# Patient Record
Sex: Female | Born: 1945 | Race: Black or African American | Hispanic: No | Marital: Single | Smoking: Never smoker
Health system: Southern US, Community
[De-identification: ages and names within clinical notes are randomized; demographics above are authoritative.]

## PROBLEM LIST (undated history)

## (undated) DIAGNOSIS — E7800 Pure hypercholesterolemia, unspecified: Secondary | ICD-10-CM

---

## 2000-06-20 ENCOUNTER — Encounter: Admission: RE | Admit: 2000-06-20 | Discharge: 2000-06-20 | Payer: Self-pay | Admitting: Internal Medicine

## 2000-06-20 ENCOUNTER — Encounter: Payer: Self-pay | Admitting: Internal Medicine

## 2001-01-13 ENCOUNTER — Emergency Department (HOSPITAL_COMMUNITY): Admission: EM | Admit: 2001-01-13 | Discharge: 2001-01-13 | Payer: Self-pay | Admitting: Emergency Medicine

## 2001-06-21 ENCOUNTER — Encounter: Payer: Self-pay | Admitting: Internal Medicine

## 2001-06-21 ENCOUNTER — Encounter: Admission: RE | Admit: 2001-06-21 | Discharge: 2001-06-21 | Payer: Self-pay | Admitting: Internal Medicine

## 2003-05-08 ENCOUNTER — Encounter: Admission: RE | Admit: 2003-05-08 | Discharge: 2003-05-08 | Payer: Self-pay | Admitting: Internal Medicine

## 2003-05-08 ENCOUNTER — Encounter: Payer: Self-pay | Admitting: Internal Medicine

## 2004-05-15 ENCOUNTER — Encounter: Admission: RE | Admit: 2004-05-15 | Discharge: 2004-05-15 | Payer: Self-pay | Admitting: Internal Medicine

## 2004-05-21 ENCOUNTER — Ambulatory Visit (HOSPITAL_COMMUNITY): Admission: RE | Admit: 2004-05-21 | Discharge: 2004-05-21 | Payer: Self-pay | Admitting: Gastroenterology

## 2006-10-05 ENCOUNTER — Encounter: Admission: RE | Admit: 2006-10-05 | Discharge: 2006-10-05 | Payer: Self-pay | Admitting: Internal Medicine

## 2007-06-05 ENCOUNTER — Encounter: Admission: RE | Admit: 2007-06-05 | Discharge: 2007-06-05 | Payer: Self-pay | Admitting: Internal Medicine

## 2007-10-25 ENCOUNTER — Encounter: Admission: RE | Admit: 2007-10-25 | Discharge: 2007-10-25 | Payer: Self-pay | Admitting: Internal Medicine

## 2009-01-23 ENCOUNTER — Encounter: Admission: RE | Admit: 2009-01-23 | Discharge: 2009-01-23 | Payer: Self-pay | Admitting: Internal Medicine

## 2009-05-14 ENCOUNTER — Encounter: Admission: RE | Admit: 2009-05-14 | Discharge: 2009-05-14 | Payer: Self-pay | Admitting: Internal Medicine

## 2010-05-28 IMAGING — US US TRANSVAGINAL NON-OB
1 series · 14 of 25 positions shown · non-contrast
Comparison: None

CLINICAL DATA: Pelvic pain

TRANSABDOMINAL AND TRANSVAGINAL ULTRASOUND OF PELVIS
TECHNIQUE: Both transabdominal and transvaginal ultrasound
examinations of the pelvis were performed including evaluation of
the uterus, ovaries, adnexal regions, and pelvic cul-de-sac.

[Series 1: us transvaginal non-ob · 0.27mm/px · 14 of 48 slices shown]
[im 1/48]
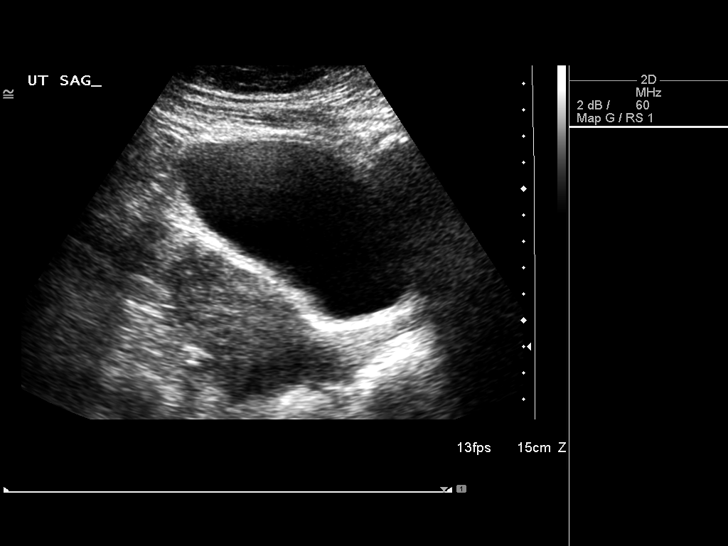
[im 4/48]
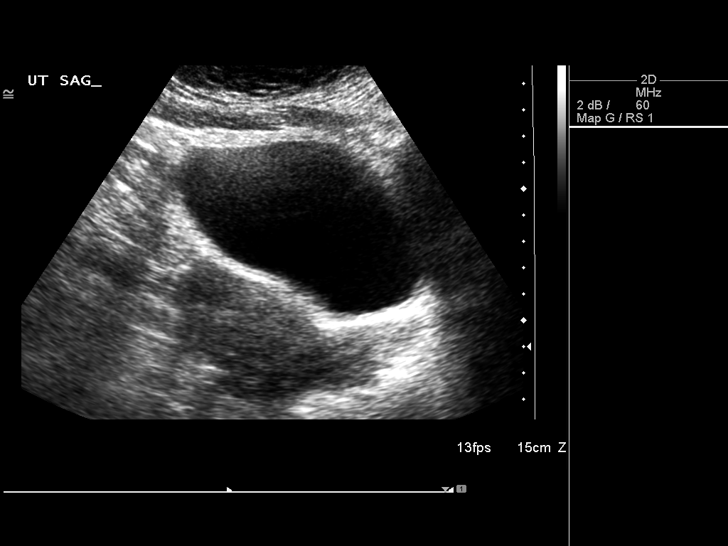
[im 8/48]
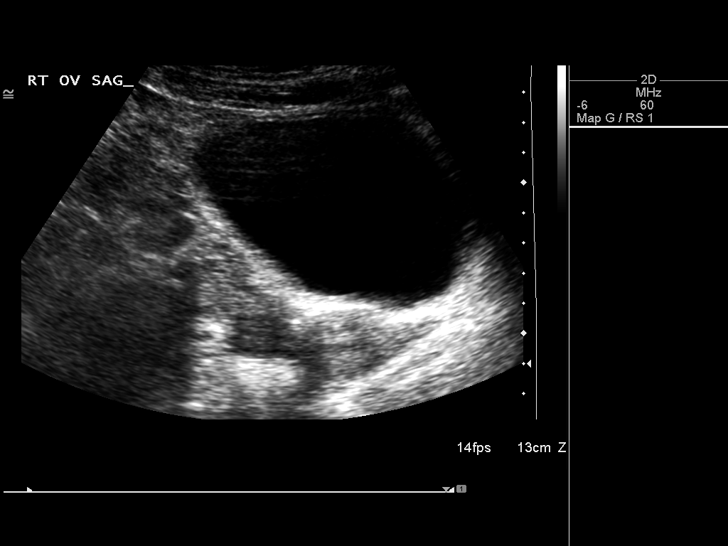
[im 12/48]
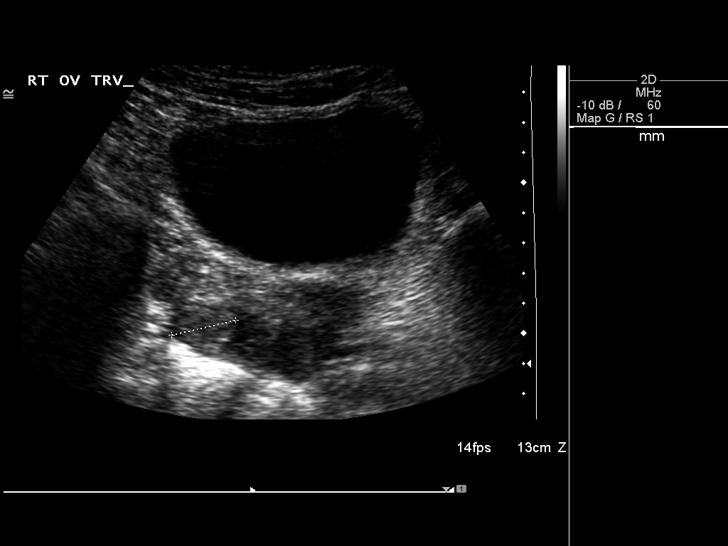
[im 16/48]
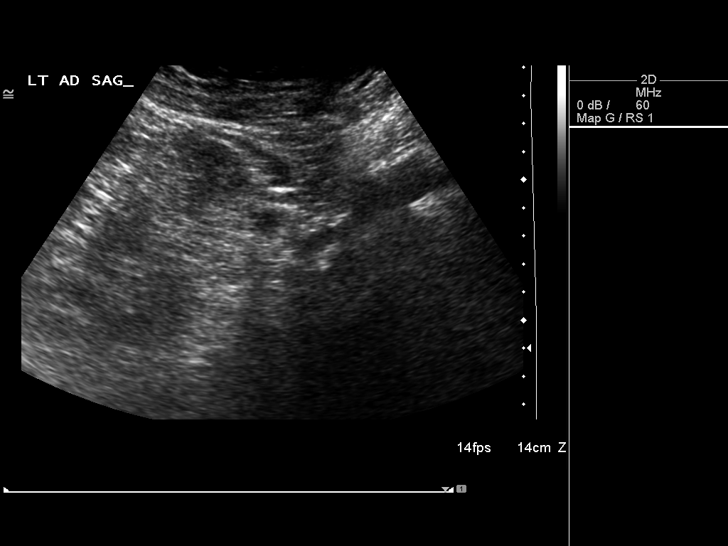
[im 18/48]
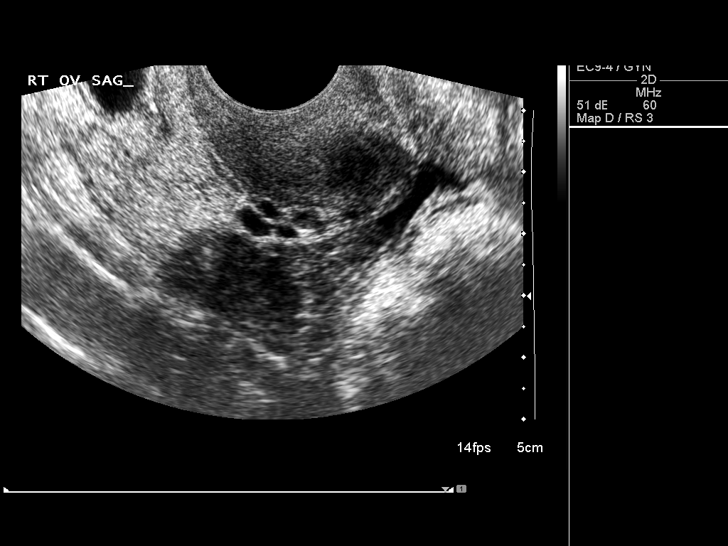
[im 22/48]
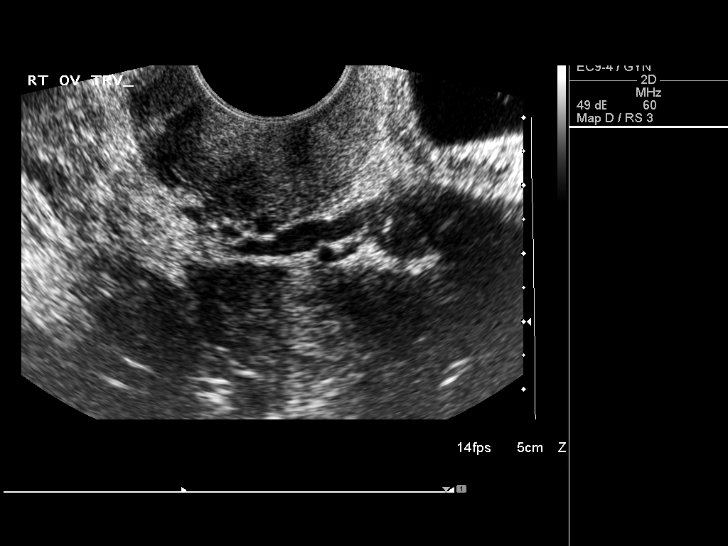
[im 26/48]
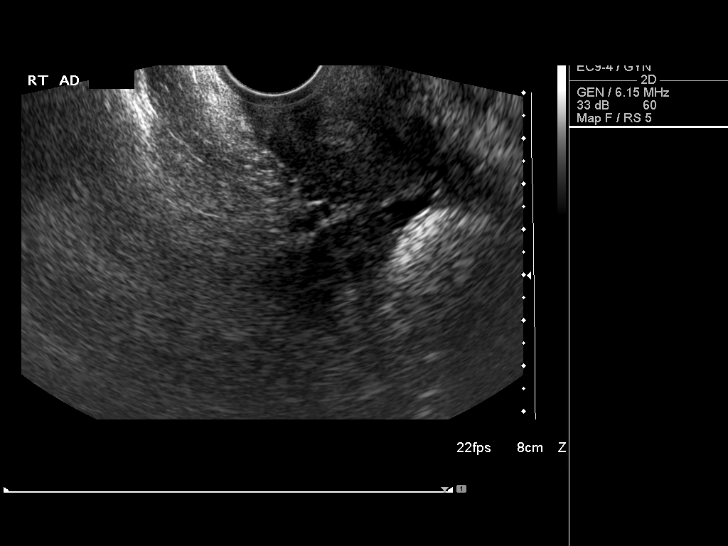
[im 30/48]
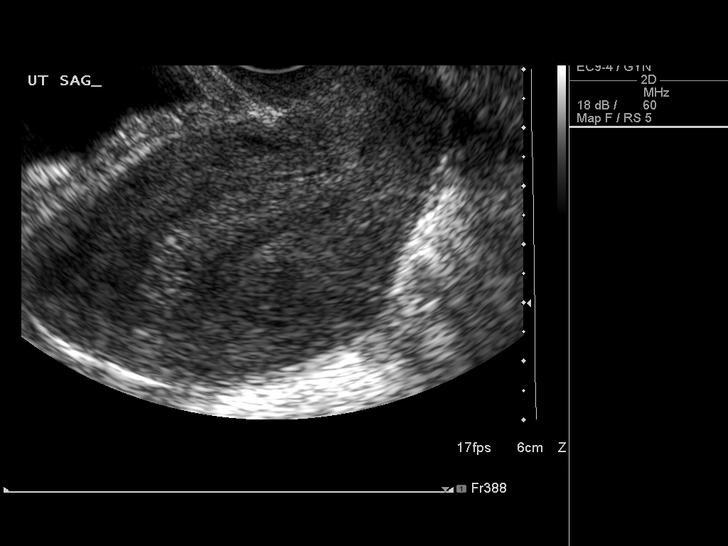
[im 32/48]
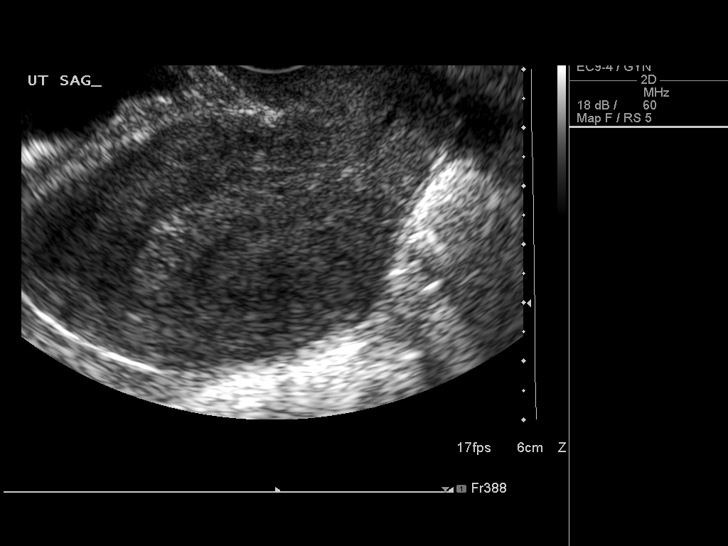
[im 36/48]
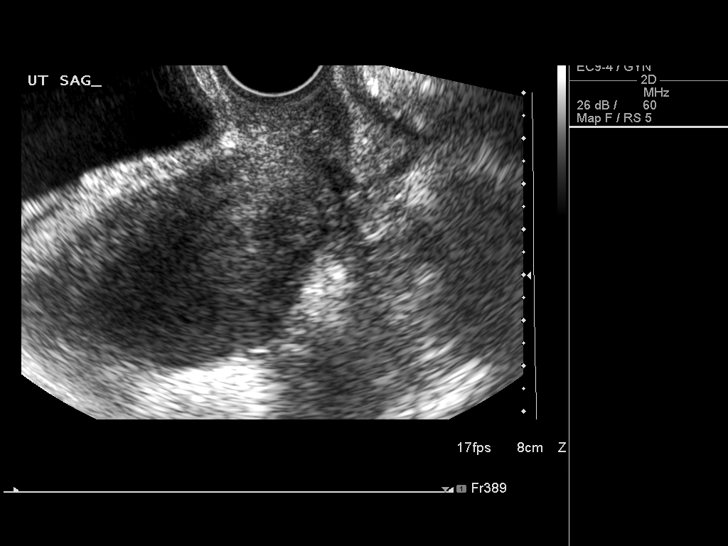
[im 40/48]
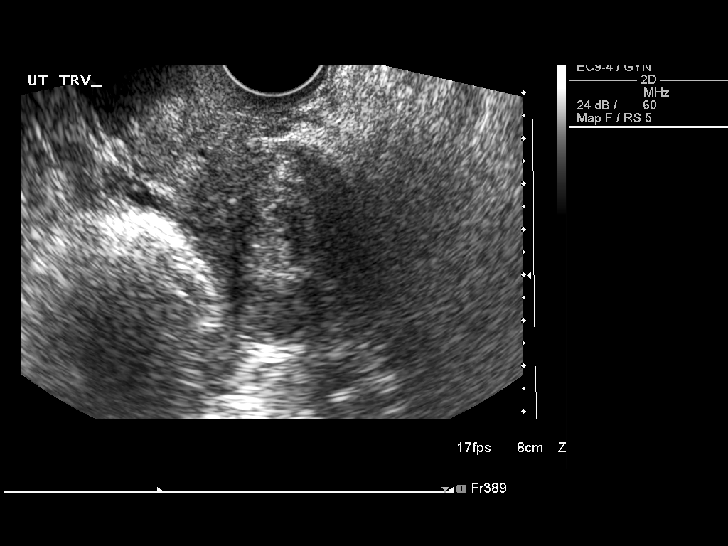
[im 44/48]
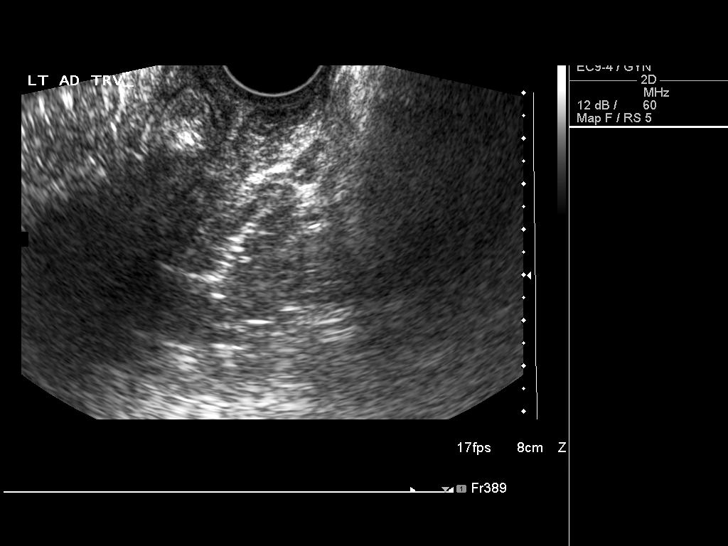
[im 48/48]
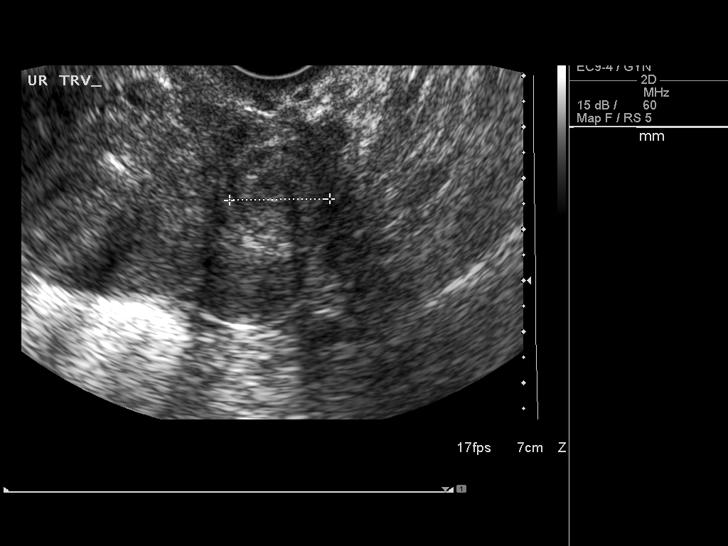

[14 of 25 positions shown; findings below may reference images not displayed]

FINDINGS: Uterus:  The uterus measures 6.6 cm sagittally, with a depth of
cm and width of 6.5 cm.  There do appear to be two uterine fibroids
present.  The larger is posterior in the lower uterine segment
measuring 2.4 x 2.2 x 2.0 cm.  A second fibroid is anteriorly
located within the upper body near the fundus measuring 1.5 x 1.2 x
1.3 cm.

Endometrium:  The endometrium is within the upper limits of normal
for a post menopausal patient not on hormone replacement therapy,
measuring 7.9 mm in thickness.  No focal abnormality is seen.

Right Ovary:  The right ovary is normal in size.

Left Ovary:  The left ovary is not visualized

Other Findings:  No free fluid is seen.
IMPRESSION: 1.  Two uterine fibroids as noted above.
2.  The endometrium is within upper limits of normal for a post
menopausal patient not on hormone replacement therapy.

## 2010-06-04 ENCOUNTER — Encounter: Admission: RE | Admit: 2010-06-04 | Discharge: 2010-06-04 | Payer: Self-pay | Admitting: Internal Medicine

## 2010-12-06 ENCOUNTER — Encounter: Payer: Self-pay | Admitting: Internal Medicine

## 2011-04-02 NOTE — Op Note (Signed)
[name], Andrea Doyle                      ACCOUNT NO.:  [id number]   MEDICAL RECORD NO.:  [id number]                   PATIENT TYPE:  AMB   LOCATION:  ENDO                                 FACILITY:  [facility]   PHYSICIAN:  Petra Kuba, M.D.                 DATE OF BIRTH:  01/20/1946   DATE OF PROCEDURE:  05/21/2004  DATE OF DISCHARGE:                                 OPERATIVE REPORT   PROCEDURE:  Colonoscopy.   INDICATION:  Screening.   Consent was signed after risks, benefits, methods, options thoroughly  discussed in the office.   MEDICINES USED:  1. Demerol 80.  2. Versed 7.5.   PROCEDURE:  Rectal inspection is pertinent for external hemorrhoids.  Small  digital exam was negative.  Video pediatric adjustable colonoscope was  inserted, easily advanced to the level of the ileocecal valve.  This did  require abdominal pressure but no position changes. To get into the cecal  pole, required rolling her on her back and then on her right side.  No  abnormality was seen on insertion.  The cecum was identified by the  appendiceal orifice and the ileocecal valve.  The scope was slowly  withdrawn, the prep was adequate.  There was some liquid stool that required  washing and suctioning, but on slow withdrawal through the colon no polyps,  tumors, masses, diverticula or other abnormalities were seen as we slowly  withdrew back to the rectum.  Anorectal pull through on retroflexion  confirmed some small hemorrhoids.  The scope was reinserted a short ways up  the left side of the colon, air was suctioned, the scope removed.  The  patient tolerated the procedure well.  There was no obvious immediate  complications.   ENDOSCOPIC DIAGNOSES:  1. Internal, external small hemorrhoids.  2. Otherwise within normal limits to the cecum.   PLAN:  Yearly rectals and guaiacs per primary care Dr. Su Hilt.  Happy to  see back p.r.n.  Otherwise, repeat screening in 5 years unless protocols  change.                                               Petra Kuba, M.D.    MEM/MEDQ  D:  05/21/2004  T:  05/21/2004  Job:  [id number]   cc:   Antony Madura, M.D.  [address]  [place]  [place] [address]  Fax: [phone]

## 2012-04-08 ENCOUNTER — Emergency Department (HOSPITAL_COMMUNITY)
Admission: EM | Admit: 2012-04-08 | Discharge: 2012-04-08 | Disposition: A | Discharge status: Home / Self Care | Payer: No Typology Code available for payment source | Attending: Emergency Medicine | Admitting: Emergency Medicine

## 2012-04-08 ENCOUNTER — Emergency Department (HOSPITAL_COMMUNITY): Payer: No Typology Code available for payment source

## 2012-04-08 ENCOUNTER — Encounter (HOSPITAL_COMMUNITY): Payer: Self-pay | Admitting: *Deleted

## 2012-04-08 DIAGNOSIS — E7800 Pure hypercholesterolemia, unspecified: Secondary | ICD-10-CM | POA: Insufficient documentation

## 2012-04-08 DIAGNOSIS — Z043 Encounter for examination and observation following other accident: Secondary | ICD-10-CM | POA: Insufficient documentation

## 2012-04-08 DIAGNOSIS — E119 Type 2 diabetes mellitus without complications: Secondary | ICD-10-CM | POA: Insufficient documentation

## 2012-04-08 HISTORY — DX: Pure hypercholesterolemia, unspecified: E78.00

## 2012-04-08 MED ORDER — IBUPROFEN 600 MG PO TABS: 600.0000 mg | ORAL_TABLET | Freq: Four times a day (QID) | ORAL | Status: AC | PRN

## 2012-04-08 MED ORDER — CYCLOBENZAPRINE HCL 10 MG PO TABS: 10.0000 mg | ORAL_TABLET | Freq: Two times a day (BID) | ORAL | Status: AC | PRN
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
## 2012-04-08 NOTE — ED Provider Notes (Signed)
Medical screening examination/treatment/procedure(s) were performed by non-physician practitioner and as supervising physician I was immediately available for consultation/collaboration.   Kristie Bracewell, MD 04/08/12 2135 
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
## 2012-11-21 ENCOUNTER — Other Ambulatory Visit: Payer: Self-pay | Admitting: Internal Medicine

## 2012-11-21 DIAGNOSIS — K30 Functional dyspepsia: Secondary | ICD-10-CM

## 2012-11-27 ENCOUNTER — Ambulatory Visit
Admission: RE | Admit: 2012-11-27 | Discharge: 2012-11-27 | Disposition: A | Discharge status: Home / Self Care | Payer: BC Managed Care – PPO | Source: Ambulatory Visit | Attending: Internal Medicine | Admitting: Internal Medicine

## 2012-11-27 DIAGNOSIS — K30 Functional dyspepsia: Secondary | ICD-10-CM

## 2013-02-08 ENCOUNTER — Ambulatory Visit
Admission: RE | Admit: 2013-02-08 | Discharge: 2013-02-08 | Disposition: A | Discharge status: Home / Self Care | Payer: Self-pay | Source: Ambulatory Visit

## 2013-02-08 ENCOUNTER — Other Ambulatory Visit: Payer: Self-pay

## 2013-02-08 DIAGNOSIS — Z1231 Encounter for screening mammogram for malignant neoplasm of breast: Secondary | ICD-10-CM

## 2014-06-21 ENCOUNTER — Emergency Department (HOSPITAL_COMMUNITY)
Admission: EM | Admit: 2014-06-21 | Discharge: 2014-06-21 | Disposition: A | Discharge status: Home / Self Care | Payer: Medicare HMO | Attending: Emergency Medicine | Admitting: Emergency Medicine

## 2014-06-21 ENCOUNTER — Encounter (HOSPITAL_COMMUNITY): Payer: Self-pay | Admitting: Emergency Medicine

## 2014-06-21 DIAGNOSIS — R42 Dizziness and giddiness: Secondary | ICD-10-CM | POA: Diagnosis not present

## 2014-06-21 DIAGNOSIS — E7800 Pure hypercholesterolemia, unspecified: Secondary | ICD-10-CM | POA: Diagnosis not present

## 2014-06-21 DIAGNOSIS — Z79899 Other long term (current) drug therapy: Secondary | ICD-10-CM | POA: Insufficient documentation

## 2014-06-21 DIAGNOSIS — R739 Hyperglycemia, unspecified: Secondary | ICD-10-CM

## 2014-06-21 DIAGNOSIS — E119 Type 2 diabetes mellitus without complications: Secondary | ICD-10-CM | POA: Diagnosis present

## 2014-06-21 LAB — BASIC METABOLIC PANEL
Anion gap: 17 — ABNORMAL HIGH (ref 5–15)
BUN: 20 mg/dL (ref 6–23)
CALCIUM: 9.6 mg/dL (ref 8.4–10.5)
CHLORIDE: 96 meq/L (ref 96–112)
CO2: 25 mEq/L (ref 19–32)
CREATININE: 0.86 mg/dL (ref 0.50–1.10)
GFR, EST AFRICAN AMERICAN: 80 mL/min — AB (ref >90)
GFR, EST NON AFRICAN AMERICAN: 69 mL/min — AB (ref >90)
Glucose, Bld: 243 mg/dL — ABNORMAL HIGH (ref 70–99)
Potassium: 3.2 mEq/L — ABNORMAL LOW (ref 3.7–5.3)
SODIUM: 138 meq/L (ref 137–147)

## 2014-06-21 LAB — CBC WITH DIFFERENTIAL/PLATELET
BASOS PCT: 0 % (ref 0–1)
Basophils Absolute: 0 10*3/uL (ref 0.0–0.1)
EOS ABS: 0.2 10*3/uL (ref 0.0–0.7)
EOS PCT: 2 % (ref 0–5)
HCT: 40.6 % (ref 36.0–46.0)
HEMOGLOBIN: 14.5 g/dL (ref 12.0–15.0)
LYMPHS PCT: 29 % (ref 12–46)
Lymphs Abs: 2.5 10*3/uL (ref 0.7–4.0)
MCH: 31.7 pg (ref 26.0–34.0)
MCHC: 35.7 g/dL (ref 30.0–36.0)
MCV: 88.8 fL (ref 78.0–100.0)
Monocytes Absolute: 0.5 10*3/uL (ref 0.1–1.0)
Monocytes Relative: 6 % (ref 3–12)
NEUTROS PCT: 63 % (ref 43–77)
Neutro Abs: 5.4 10*3/uL (ref 1.7–7.7)
PLATELETS: 317 10*3/uL (ref 150–400)
RBC: 4.57 MIL/uL (ref 3.87–5.11)
RDW: 12.7 % (ref 11.5–15.5)
WBC: 8.6 10*3/uL (ref 4.0–10.5)

## 2014-06-21 LAB — CBG MONITORING, ED
GLUCOSE-CAPILLARY: 215 mg/dL — AB (ref 70–99)
Glucose-Capillary: 154 mg/dL — ABNORMAL HIGH (ref 70–99)

## 2014-06-21 MED ORDER — SODIUM CHLORIDE 0.9 % IV BOLUS (SEPSIS)
1000.0000 mL | Freq: Once | INTRAVENOUS | Status: AC
  Administered 2014-06-21: 1000 mL via INTRAVENOUS

## 2014-06-21 MED ORDER — MECLIZINE HCL 50 MG PO TABS: 50.0000 mg | ORAL_TABLET | Freq: Three times a day (TID) | ORAL | Status: AC | PRN

## 2014-06-21 MED ORDER — MECLIZINE HCL 25 MG PO TABS
25.0000 mg | ORAL_TABLET | Freq: Once | ORAL | Status: DC
  Filled 2014-06-21: qty 1
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
## 2014-06-21 NOTE — Discharge Instructions (Signed)
Dizziness °Dizziness is a common problem. It is a feeling of unsteadiness or light-headedness. You may feel like you are about to faint. Dizziness can lead to injury if you stumble or fall. A person of any age group can suffer from dizziness, but dizziness is more common in older adults. °CAUSES  °Dizziness can be caused by many different things, including: °· Middle ear problems. °· Standing for too long. °· Infections. °· An allergic reaction. °· Aging. °· An emotional response to something, such as the sight of blood. °· Side effects of medicines. °· Tiredness. °· Problems with circulation or blood pressure. °· Excessive use of alcohol or medicines, or illegal drug use. °· Breathing too fast (hyperventilation). °· An irregular heart rhythm (arrhythmia). °· A low red blood cell count (anemia). °· Pregnancy. °· Vomiting, diarrhea, fever, or other illnesses that cause body fluid loss (dehydration). °· Diseases or conditions such as Parkinson's disease, high blood pressure (hypertension), diabetes, and thyroid problems. °· Exposure to extreme heat. °DIAGNOSIS  °Your health care provider will ask about your symptoms, perform a physical exam, and perform an electrocardiogram (ECG) to record the electrical activity of your heart. Your health care provider may also perform other heart or blood tests to determine the cause of your dizziness. These may include: °· Transthoracic echocardiogram (TTE). During echocardiography, sound waves are used to evaluate how blood flows through your heart. °· Transesophageal echocardiogram (TEE). °· Cardiac monitoring. This allows your health care provider to monitor your heart rate and rhythm in real time. °· Holter monitor. This is a portable device that records your heartbeat and can help diagnose heart arrhythmias. It allows your health care provider to track your heart activity for several days if needed. °· Stress tests by exercise or by giving medicine that makes the heart beat  faster. °TREATMENT  °Treatment of dizziness depends on the cause of your symptoms and can vary greatly. °HOME CARE INSTRUCTIONS  °· Drink enough fluids to keep your urine clear or pale yellow. This is especially important in very hot weather. In older adults, it is also important in cold weather. °· Take your medicine exactly as directed if your dizziness is caused by medicines. When taking blood pressure medicines, it is especially important to get up slowly. °¨ Rise slowly from chairs and steady yourself until you feel okay. °¨ In the morning, first sit up on the side of the bed. When you feel okay, stand slowly while holding onto something until you know your balance is fine. °· Move your legs often if you need to stand in one place for a long time. Tighten and relax your muscles in your legs while standing. °· Have someone stay with you for 1-2 days if dizziness continues to be a problem. Do this until you feel you are well enough to stay alone. Have the person call your health care provider if he or she notices changes in you that are concerning. °· Do not drive or use heavy machinery if you feel dizzy. °· Do not drink alcohol. °SEEK IMMEDIATE MEDICAL CARE IF:  °· Your dizziness or light-headedness gets worse. °· You feel nauseous or vomit. °· You have problems talking, walking, or using your arms, hands, or legs. °· You feel weak. °· You are not thinking clearly or you have trouble forming sentences. It may take a friend or family member to notice this. °· You have chest pain, abdominal pain, shortness of breath, or sweating. °· Your vision changes. °· You notice   any bleeding.  You have side effects from medicine that seems to be getting worse rather than better. MAKE SURE YOU:   Understand these instructions.  Will watch your condition.  Will get help right away if you are not doing well or get worse. Document Released: 04/27/2001 Document Revised: 11/06/2013 Document Reviewed: 05/21/2011 [facility]  Patient Information 2015 [place], [place]. This information is not intended to replace advice given to you by your health care provider. Make sure you discuss any questions you have with your health care provider.  Benign Positional Vertigo Vertigo means you feel like you or your surroundings are moving when they are not. Benign positional vertigo is the most common form of vertigo. Benign means that the cause of your condition is not serious. Benign positional vertigo is more common in older adults. CAUSES  Benign positional vertigo is the result of an upset in the labyrinth system. This is an area in the middle ear that helps control your balance. This may be caused by a viral infection, head injury, or repetitive motion. However, often no specific cause is found. SYMPTOMS  Symptoms of benign positional vertigo occur when you move your head or eyes in different directions. Some of the symptoms may include:  Loss of balance and falls.  Vomiting.  Blurred vision.  Dizziness.  Nausea.  Involuntary eye movements (nystagmus). DIAGNOSIS  Benign positional vertigo is usually diagnosed by physical exam. If the specific cause of your benign positional vertigo is unknown, your caregiver may perform imaging tests, such as magnetic resonance imaging (MRI) or computed tomography (CT). TREATMENT  Your caregiver may recommend movements or procedures to correct the benign positional vertigo. Medicines such as meclizine, benzodiazepines, and medicines for nausea may be used to treat your symptoms. In rare cases, if your symptoms are caused by certain conditions that affect the inner ear, you may need surgery. HOME CARE INSTRUCTIONS   Follow your caregiver's instructions.  Move slowly. Do not make sudden body or head movements.  Avoid driving.  Avoid operating heavy machinery.  Avoid performing any tasks that would be dangerous to you or others during a vertigo episode.  Drink enough fluids to keep  your urine clear or pale yellow. SEEK IMMEDIATE MEDICAL CARE IF:   You develop problems with walking, weakness, numbness, or using your arms, hands, or legs.  You have difficulty speaking.  You develop severe headaches.  Your nausea or vomiting continues or gets worse.  You develop visual changes.  Your family or friends notice any behavioral changes.  Your condition gets worse.  You have a fever.  You develop a stiff neck or sensitivity to light. MAKE SURE YOU:   Understand these instructions.  Will watch your condition.  Will get help right away if you are not doing well or get worse. Document Released: 08/09/2006 Document Revised: 01/24/2012 Document Reviewed: 07/22/2011 [facility]Exit[facility] Patient Information 2015 [place]ExitCare, [place]LL[place]. This information is not intended to replace advice given to you by your health care provider. Make sure you discuss any questions you have with your health care provider. Hyperglycemia Hyperglycemia occurs when the glucose (sugar) in your blood is too high. Hyperglycemia can happen for many reasons, but it most often happens to people who do not know they have diabetes or are not managing their diabetes properly.  CAUSES  Whether you have diabetes or not, there are other causes of hyperglycemia. Hyperglycemia can occur when you have diabetes, but it can also occur in other situations that you might not be  as aware of, such as: Diabetes  If you have diabetes and are having problems controlling your blood glucose, hyperglycemia could occur because of some of the following reasons:  Not following your meal plan.  Not taking your diabetes medications or not taking it properly.  Exercising less or doing less activity than you normally do.  Being sick. Pre-diabetes  This cannot be ignored. Before people develop Type 2 diabetes, they almost always have "pre-diabetes." This is when your blood glucose levels are higher than normal, but not yet high enough to  be diagnosed as diabetes. Research has shown that some long-term damage to the body, especially the heart and circulatory system, may already be occurring during pre-diabetes. If you take action to manage your blood glucose when you have pre-diabetes, you may delay or prevent Type 2 diabetes from developing. Stress  If you have diabetes, you may be "diet" controlled or on oral medications or insulin to control your diabetes. However, you may find that your blood glucose is higher than usual in the hospital whether you have diabetes or not. This is often referred to as "stress hyperglycemia." Stress can elevate your blood glucose. This happens because of hormones put out by the body during times of stress. If stress has been the cause of your high blood glucose, it can be followed regularly by your caregiver. That way he/she can make sure your hyperglycemia does not continue to get worse or progress to diabetes. Steroids  Steroids are medications that act on the infection fighting system (immune system) to block inflammation or infection. One side effect can be a rise in blood glucose. Most people can produce enough extra insulin to allow for this rise, but for those who cannot, steroids make blood glucose levels go even higher. It is not unusual for steroid treatments to "uncover" diabetes that is developing. It is not always possible to determine if the hyperglycemia will go away after the steroids are stopped. A special blood test called an A1c is sometimes done to determine if your blood glucose was elevated before the steroids were started. SYMPTOMS  Thirsty.  Frequent urination.  Dry mouth.  Blurred vision.  Tired or fatigue.  Weakness.  Sleepy.  Tingling in feet or leg. DIAGNOSIS  Diagnosis is made by monitoring blood glucose in one or all of the following ways:  A1c test. This is a chemical found in your blood.  Fingerstick blood glucose monitoring.  Laboratory  results. TREATMENT  First, knowing the cause of the hyperglycemia is important before the hyperglycemia can be treated. Treatment may include, but is not be limited to:  Education.  Change or adjustment in medications.  Change or adjustment in meal plan.  Treatment for an illness, infection, etc.  More frequent blood glucose monitoring.  Change in exercise plan.  Decreasing or stopping steroids.  Lifestyle changes. HOME CARE INSTRUCTIONS   Test your blood glucose as directed.  Exercise regularly. Your caregiver will give you instructions about exercise. Pre-diabetes or diabetes which comes on with stress is helped by exercising.  Eat wholesome, balanced meals. Eat often and at regular, fixed times. Your caregiver or nutritionist will give you a meal plan to guide your sugar intake.  Being at an ideal weight is important. If needed, losing as little as 10 to 15 pounds may help improve blood glucose levels. SEEK MEDICAL CARE IF:   You have questions about medicine, activity, or diet.  You continue to have symptoms (problems such as increased thirst, urination,  or weight gain). SEEK IMMEDIATE MEDICAL CARE IF:   You are vomiting or have diarrhea.  Your breath smells fruity.  You are breathing faster or slower.  You are very sleepy or incoherent.  You have numbness, tingling, or pain in your feet or hands.  You have chest pain.  Your symptoms get worse even though you have been following your caregiver's orders.  If you have any other questions or concerns. Document Released: 04/27/2001 Document Revised: 01/24/2012 Document Reviewed: 02/28/2012 [facility] Patient Information 2015 [place], [place]. This information is not intended to replace advice given to you by your health care provider. Make sure you discuss any questions you have with your health care provider.

## 2014-06-21 NOTE — ED Provider Notes (Signed)
CSN: [id number]     Arrival date & time 06/21/14  1608 History   First MD Initiated Contact with Patient 06/21/14 1623     Chief Complaint  Patient presents with  . Hyperglycemia     (Consider location/radiation/quality/duration/timing/severity/associated sxs/prior Treatment) HPI Comments: Patient presents to the emergency department with chief complaint of hyperglycemia. She states she checked her blood sugar today and it was 267. She states that she became very concerned that this. She states that she normally takes metformin daily. She does not regularly check her blood sugar.  Additionally, she complains of dizziness which started this morning. She states the symptoms have been intermittent since this morning. It is worsened with head movement. She describes it as though the room is spinning. She has a history of vertigo. States this feels similar.  She also complains of a mild headache. She has tried taking some aspirin and finger with some relief. She denies any fevers, chills, nausea, or vomiting. There no aggravating factors.  The history is provided by the patient. No language interpreter was used.    Past Medical History  Diagnosis Date  . Diabetes mellitus   . High cholesterol    History reviewed. No pertinent past surgical history. History reviewed. No pertinent family history. History  Substance Use Topics  . Smoking status: Never Smoker   . Smokeless tobacco: Never Used  . Alcohol Use: No   OB History   Grav Para Term Preterm Abortions TAB SAB Ect Mult Living                 Review of Systems  Constitutional: Negative for fever and chills.  Respiratory: Negative for shortness of breath.   Cardiovascular: Negative for chest pain.  Gastrointestinal: Negative for nausea, vomiting, diarrhea and constipation.  Genitourinary: Negative for dysuria.  Neurological: Positive for dizziness.  All other systems reviewed and are negative.     Allergies  Review of  patient's allergies indicates no known allergies.  Home Medications   Prior to Admission medications   Medication Sig Start Date End Date Taking? Authorizing Provider  Calcium Carbonate-Vitamin D (CALCIUM 600+D) 600-200 MG-UNIT TABS Take 1 tablet by mouth daily.    Historical Provider, MD  fish oil-omega-3 fatty acids 1000 MG capsule Take 1 g by mouth daily.    Historical Provider, MD  metFORMIN (GLUCOPHAGE) 500 MG tablet Take 500 mg by mouth 2 (two) times daily with a meal.    Historical Provider, MD  Multiple Vitamin (MULITIVITAMIN WITH MINERALS) TABS Take 1 tablet by mouth daily.    Historical Provider, MD  simvastatin (ZOCOR) 20 MG tablet Take 20 mg by mouth at bedtime.    Historical Provider, MD  vitamin B-12 (CYANOCOBALAMIN) 1000 MCG tablet Take 1,000 mcg by mouth daily.    Historical Provider, MD   There were no vitals taken for this visit. Physical Exam  Nursing note and vitals reviewed. Constitutional: She is oriented to person, place, and time. She appears well-developed and well-nourished.  HENT:  Head: Normocephalic and atraumatic.  Eyes: Conjunctivae and EOM are normal. Pupils are equal, round, and reactive to light.  Horizontal nystagmus  Neck: Normal range of motion. Neck supple.  Cardiovascular: Normal rate and regular rhythm.  Exam reveals no gallop and no friction rub.   No murmur heard. Pulmonary/Chest: Effort normal and breath sounds normal. No respiratory distress. She has no wheezes. She has no rales. She exhibits no tenderness.  Abdominal: Soft. Bowel sounds are normal. She exhibits no distension  and no mass. There is no tenderness. There is no rebound and no guarding.  Musculoskeletal: Normal range of motion. She exhibits no edema and no tenderness.  Neurological: She is alert and oriented to person, place, and time.  Skin: Skin is warm and dry.  Psychiatric: She has a normal mood and affect. Her behavior is normal. Judgment and thought content normal.    ED  Course  Procedures (including critical care time) Results for orders placed during the hospital encounter of 06/21/14  CBC WITH DIFFERENTIAL      Result Value Ref Range   WBC 8.6  4.0 - 10.5 K/uL   RBC 4.57  3.87 - 5.11 MIL/uL   Hemoglobin 14.5  12.0 - 15.0 g/dL   HCT 16.1  09.6 - 04.5 %   MCV 88.8  78.0 - 100.0 fL   MCH 31.7  26.0 - 34.0 pg   MCHC 35.7  30.0 - 36.0 g/dL   RDW 40.9  81.1 - 91.4 %   Platelets 317  150 - 400 K/uL   Neutrophils Relative % 63  43 - 77 %   Neutro Abs 5.4  1.7 - 7.7 K/uL   Lymphocytes Relative 29  12 - 46 %   Lymphs Abs 2.5  0.7 - 4.0 K/uL   Monocytes Relative 6  3 - 12 %   Monocytes Absolute 0.5  0.1 - 1.0 K/uL   Eosinophils Relative 2  0 - 5 %   Eosinophils Absolute 0.2  0.0 - 0.7 K/uL   Basophils Relative 0  0 - 1 %   Basophils Absolute 0.0  0.0 - 0.1 K/uL  BASIC METABOLIC PANEL      Result Value Ref Range   Sodium 138  137 - 147 mEq/L   Potassium 3.2 (*) 3.7 - 5.3 mEq/L   Chloride 96  96 - 112 mEq/L   CO2 25  19 - 32 mEq/L   Glucose, Bld 243 (*) 70 - 99 mg/dL   BUN 20  6 - 23 mg/dL   Creatinine, Ser 7.82  0.50 - 1.10 mg/dL   Calcium 9.6  8.4 - 95.6 mg/dL   GFR calc non Af Amer 69 (*) >90 mL/min   GFR calc Af Amer 80 (*) >90 mL/min   Anion gap 17 (*) 5 - 15  CBG MONITORING, ED      Result Value Ref Range   Glucose-Capillary 215 (*) 70 - 99 mg/dL   Comment 1 Documented in Chart     Comment 2 Notify RN    CBG MONITORING, ED      Result Value Ref Range   Glucose-Capillary 154 (*) 70 - 99 mg/dL   Comment 1 Documented in Chart     Comment 2 Notify RN         EKG Interpretation None      MDM   Final diagnoses:  None    Patient with elevated blood sugar, as well as dizziness, which I believe to be vertigo. She has a history of the same. She does have positive horizontal nystagmus. Symptoms are worsened with head movement. She states symptoms have been intermittent since this morning. She reports a history of inner ear  problems.  7:01 PM Patient is feeling much better. She is neurovascularly intact. Ambulates without difficulty. Patient seen by and discussed with Dr. Romeo Apple who agrees with patient to be discharged to home. Recommend close followup with primary care provider. Patient understands and agrees with the plan. She is  stable and ready for discharge.  Roxy Horseman, PA-C 06/21/14 1902

## 2014-06-22 NOTE — ED Provider Notes (Signed)
Medical screening examination/treatment/procedure(s) were conducted as a shared visit with non-physician practitioner(s) and myself.  I personally evaluated the patient during the encounter.   EKG Interpretation None      I interviewed and examined the patient. Lungs are CTAB. Cardiac exam wnl. Abdomen soft.  Sx improved w/ IVF hydration. Do not think this is a CVA. Pt has hx of dizziness in the past. Plan for d/c home.   Purvis SheffieldForrest Navaya Wiatrek, MD 06/22/14 [phone]

## 2014-08-01 ENCOUNTER — Other Ambulatory Visit: Payer: Self-pay

## 2014-08-01 DIAGNOSIS — Z1231 Encounter for screening mammogram for malignant neoplasm of breast: Secondary | ICD-10-CM

## 2014-08-14 ENCOUNTER — Ambulatory Visit: Payer: BC Managed Care – PPO

## 2014-08-19 ENCOUNTER — Ambulatory Visit
Admission: RE | Admit: 2014-08-19 | Discharge: 2014-08-19 | Disposition: A | Discharge status: Home / Self Care | Payer: Medicare HMO | Source: Ambulatory Visit

## 2014-08-19 DIAGNOSIS — Z1231 Encounter for screening mammogram for malignant neoplasm of breast: Secondary | ICD-10-CM

## 2014-08-20 ENCOUNTER — Other Ambulatory Visit: Payer: Self-pay | Admitting: Internal Medicine

## 2014-08-20 DIAGNOSIS — R928 Other abnormal and inconclusive findings on diagnostic imaging of breast: Secondary | ICD-10-CM

## 2014-08-28 ENCOUNTER — Ambulatory Visit
Admission: RE | Admit: 2014-08-28 | Discharge: 2014-08-28 | Disposition: A | Discharge status: Home / Self Care | Payer: Medicare HMO | Source: Ambulatory Visit | Attending: Internal Medicine | Admitting: Internal Medicine

## 2014-08-28 DIAGNOSIS — R928 Other abnormal and inconclusive findings on diagnostic imaging of breast: Secondary | ICD-10-CM

## 2015-01-22 ENCOUNTER — Other Ambulatory Visit: Payer: Self-pay | Admitting: Internal Medicine

## 2015-01-22 DIAGNOSIS — N63 Unspecified lump in unspecified breast: Secondary | ICD-10-CM

## 2015-01-30 ENCOUNTER — Encounter (HOSPITAL_COMMUNITY): Payer: Self-pay | Admitting: Emergency Medicine

## 2015-01-30 ENCOUNTER — Emergency Department (HOSPITAL_COMMUNITY)
Admission: EM | Admit: 2015-01-30 | Discharge: 2015-01-30 | Disposition: A | Discharge status: Home / Self Care | Payer: Medicare Other | Attending: Emergency Medicine | Admitting: Emergency Medicine

## 2015-01-30 DIAGNOSIS — R1013 Epigastric pain: Secondary | ICD-10-CM | POA: Diagnosis not present

## 2015-01-30 DIAGNOSIS — Z79899 Other long term (current) drug therapy: Secondary | ICD-10-CM | POA: Diagnosis not present

## 2015-01-30 DIAGNOSIS — R197 Diarrhea, unspecified: Secondary | ICD-10-CM | POA: Insufficient documentation

## 2015-01-30 DIAGNOSIS — Z880 Allergy status to penicillin: Secondary | ICD-10-CM | POA: Diagnosis not present

## 2015-01-30 DIAGNOSIS — E780 Pure hypercholesterolemia: Secondary | ICD-10-CM | POA: Insufficient documentation

## 2015-01-30 DIAGNOSIS — R109 Unspecified abdominal pain: Secondary | ICD-10-CM

## 2015-01-30 DIAGNOSIS — R1010 Upper abdominal pain, unspecified: Secondary | ICD-10-CM | POA: Diagnosis present

## 2015-01-30 DIAGNOSIS — E119 Type 2 diabetes mellitus without complications: Secondary | ICD-10-CM | POA: Insufficient documentation

## 2015-01-30 DIAGNOSIS — R1110 Vomiting, unspecified: Secondary | ICD-10-CM | POA: Diagnosis not present

## 2015-01-30 DIAGNOSIS — Z7982 Long term (current) use of aspirin: Secondary | ICD-10-CM | POA: Diagnosis not present

## 2015-01-30 LAB — CBC WITH DIFFERENTIAL/PLATELET
BASOS ABS: 0 10*3/uL (ref 0.0–0.1)
Basophils Relative: 0 % (ref 0–1)
Eosinophils Absolute: 0.3 10*3/uL (ref 0.0–0.7)
Eosinophils Relative: 3 % (ref 0–5)
HCT: 40.9 % (ref 36.0–46.0)
HEMOGLOBIN: 14.1 g/dL (ref 12.0–15.0)
LYMPHS ABS: 0.8 10*3/uL (ref 0.7–4.0)
Lymphocytes Relative: 8 % — ABNORMAL LOW (ref 12–46)
MCH: 31.8 pg (ref 26.0–34.0)
MCHC: 34.5 g/dL (ref 30.0–36.0)
MCV: 92.3 fL (ref 78.0–100.0)
Monocytes Absolute: 0.5 10*3/uL (ref 0.1–1.0)
Monocytes Relative: 5 % (ref 3–12)
NEUTROS ABS: 8.1 10*3/uL — AB (ref 1.7–7.7)
NEUTROS PCT: 84 % — AB (ref 43–77)
PLATELETS: 245 10*3/uL (ref 150–400)
RBC: 4.43 MIL/uL (ref 3.87–5.11)
RDW: 13.3 % (ref 11.5–15.5)
WBC: 9.6 10*3/uL (ref 4.0–10.5)

## 2015-01-30 LAB — COMPREHENSIVE METABOLIC PANEL
ALK PHOS: 83 U/L (ref 39–117)
ALT: 21 U/L (ref 0–35)
AST: 21 U/L (ref 0–37)
Albumin: 4 g/dL (ref 3.5–5.2)
Anion gap: 11 (ref 5–15)
BILIRUBIN TOTAL: 1.1 mg/dL (ref 0.3–1.2)
BUN: 16 mg/dL (ref 6–23)
CHLORIDE: 103 mmol/L (ref 96–112)
CO2: 23 mmol/L (ref 19–32)
CREATININE: 0.76 mg/dL (ref 0.50–1.10)
Calcium: 8.8 mg/dL (ref 8.4–10.5)
GFR calc Af Amer: >90 mL/min (ref >90)
GFR, EST NON AFRICAN AMERICAN: 86 mL/min — AB (ref >90)
Glucose, Bld: 204 mg/dL — ABNORMAL HIGH (ref 70–99)
POTASSIUM: 4.1 mmol/L (ref 3.5–5.1)
Sodium: 137 mmol/L (ref 135–145)
Total Protein: 7.6 g/dL (ref 6.0–8.3)

## 2015-01-30 LAB — LIPASE, BLOOD: Lipase: 31 U/L (ref 11–59)

## 2015-01-30 LAB — URINALYSIS, ROUTINE W REFLEX MICROSCOPIC
Bilirubin Urine: NEGATIVE
Glucose, UA: 100 mg/dL — AB
HGB URINE DIPSTICK: NEGATIVE
Ketones, ur: NEGATIVE mg/dL
Leukocytes, UA: NEGATIVE
NITRITE: NEGATIVE
Protein, ur: NEGATIVE mg/dL
SPECIFIC GRAVITY, URINE: 1.023 (ref 1.005–1.030)
Urobilinogen, UA: 0.2 mg/dL (ref 0.0–1.0)
pH: 7.5 (ref 5.0–8.0)

## 2015-01-30 MED ORDER — IBUPROFEN 200 MG PO TABS
600.0000 mg | ORAL_TABLET | Freq: Once | ORAL | Status: AC
  Administered 2015-01-30: 600 mg via ORAL
  Filled 2015-01-30: qty 3

## 2015-01-30 MED ORDER — SODIUM CHLORIDE 0.9 % IV BOLUS (SEPSIS)
1000.0000 mL | Freq: Once | INTRAVENOUS | Status: AC
  Administered 2015-01-30: 1000 mL via INTRAVENOUS

## 2015-01-30 MED ORDER — ONDANSETRON HCL 4 MG/2ML IJ SOLN
4.0000 mg | Freq: Once | INTRAMUSCULAR | Status: AC
  Administered 2015-01-30: 4 mg via INTRAVENOUS
  Filled 2015-01-30: qty 2

## 2015-01-30 MED ORDER — HYOSCYAMINE SULFATE 0.125 MG PO TABS
0.1250 mg | ORAL_TABLET | Freq: Once | ORAL | Status: AC
  Administered 2015-01-30: 0.125 mg via ORAL
  Filled 2015-01-30: qty 1

## 2015-01-30 MED ORDER — ONDANSETRON HCL 4 MG PO TABS: 4.0000 mg | ORAL_TABLET | Freq: Four times a day (QID) | ORAL | Status: AC

## 2015-01-30 NOTE — ED Notes (Signed)
Pt states on Wednesday she started having sneezing, cough, and congestion [facility]  Pt states she laid around all day yesterday because she just didn't feel good  Pt states as the day went on she felt worse  Pt states this morning around 1am she started having vomiting and diarrhea  Pt states this morning she has a headache and upset stomach

## 2015-01-30 NOTE — ED Notes (Signed)
Pt informed of need for urine sample, instructed to provide urine sample as soon as possible. Pt states she is unable to void at this time. Intervention: IV fluid bolus in progress.

## 2015-01-30 NOTE — ED Provider Notes (Signed)
CSN: [id number]     Arrival date & time 01/30/15  0609 History   First MD Initiated Contact with Patient 01/30/15 [phone]     Chief Complaint  Patient presents with  . Emesis  . Diarrhea     Patient is a 69 y.o. female presenting with vomiting and diarrhea. The history is provided by the patient. No language interpreter was used.  Emesis Associated symptoms: diarrhea   Diarrhea Associated symptoms: vomiting    Andrea Doyle presents for evaluation of nausea, vomiting, diarrhea, abdominal pain. Yesterday she began feeling poorly with runny, stuffy nose, coughing, fatigue, headache. She tried an over-the-counter decongestants and went home and went to bed. She woke this morning at 1 AM with sharp and crampy upper abdominal pain, vomiting, diarrhea. She's had approximately 5 episodes of emesis that has been non-bloody. Stools are nonbloody. She has subjective fevers with feeling hot at times. She visited her sister a few days ago who was sick with upper respiratory symptoms. Symptoms are moderate, constant, worsening.  Past Medical History  Diagnosis Date  . Diabetes mellitus   . High cholesterol    History reviewed. No pertinent past surgical history. Family History  Problem Relation Age of Onset  . Alzheimer's disease Mother   . Alzheimer's disease Father    History  Substance Use Topics  . Smoking status: Never Smoker   . Smokeless tobacco: Never Used  . Alcohol Use: No   OB History    No data available     Review of Systems  Gastrointestinal: Positive for vomiting and diarrhea.  All other systems reviewed and are negative.     Allergies  Codeine and Penicillins  Home Medications   Prior to Admission medications   Medication Sig Start Date End Date Taking? Authorizing Provider  aspirin 325 MG tablet Take 650 mg by mouth every 4 (four) hours as needed for mild pain.   Yes Historical Provider, MD  bismuth subsalicylate (PEPTO BISMOL) 262 MG/15ML suspension Take 30 mLs by  mouth every 6 (six) hours as needed for diarrhea or loose stools.   Yes Historical Provider, MD  Calcium Carbonate-Vitamin D (CALCIUM 600+D) 600-200 MG-UNIT TABS Take 1 tablet by mouth daily.   Yes Historical Provider, MD  cetirizine (ZYRTEC) 10 MG tablet Take 10 mg by mouth daily as needed for allergies.   Yes Historical Provider, MD  chlorthalidone (HYGROTON) 25 MG tablet Take 25 mg by mouth as needed (fluid).   Yes Historical Provider, MD  fish oil-omega-3 fatty acids 1000 MG capsule Take 1 g by mouth daily.   Yes Historical Provider, MD  metFORMIN (GLUCOPHAGE) 500 MG tablet Take 500 mg by mouth 2 (two) times daily with a meal.   Yes Historical Provider, MD  Multiple Vitamin (MULITIVITAMIN WITH MINERALS) TABS Take 1 tablet by mouth daily.   Yes Historical Provider, MD  simvastatin (ZOCOR) 20 MG tablet Take 20 mg by mouth at bedtime.   Yes Historical Provider, MD  vitamin B-12 (CYANOCOBALAMIN) 1000 MCG tablet Take 1,000 mcg by mouth daily.   Yes Historical Provider, MD  meclizine (ANTIVERT) 50 MG tablet Take 1 tablet (50 mg total) by mouth 3 (three) times daily as needed. 06/21/14   Roxy Horseman, PA-C   BP 114/47 mmHg  Pulse 81  Temp(Src) 98.5 F (36.9 C) (Oral)  Resp 18  Ht  (1.626 m)  Wt 194 lb (87.998 kg)  BMI 33.28 kg/m2  SpO2 98% Physical Exam  Constitutional: She is oriented to person, place,  and time. She appears well-developed and well-nourished.  HENT:  Head: Normocephalic and atraumatic.  Cardiovascular: Normal rate and regular rhythm.   No murmur heard. Pulmonary/Chest: Effort normal and breath sounds normal. No respiratory distress.  Abdominal: Soft. There is no rebound and no guarding.  Mild-to-moderate epigastric tenderness  Musculoskeletal: She exhibits no edema or tenderness.  Neurological: She is alert and oriented to person, place, and time.  Skin: Skin is warm and dry.  Psychiatric: She has a normal mood and affect. Her behavior is normal.  Nursing note and  vitals reviewed.   ED Course  Procedures (including critical care time) Labs Review Labs Reviewed  CBC WITH DIFFERENTIAL/PLATELET - Abnormal; Notable for the following:    Neutrophils Relative % 84 (*)    Neutro Abs 8.1 (*)    Lymphocytes Relative 8 (*)    All other components within normal limits  COMPREHENSIVE METABOLIC PANEL - Abnormal; Notable for the following:    Glucose, Bld 204 (*)    GFR calc non Af Amer 86 (*)    All other components within normal limits  URINALYSIS, ROUTINE W REFLEX MICROSCOPIC - Abnormal; Notable for the following:    Glucose, UA 100 (*)    All other components within normal limits  LIPASE, BLOOD    Imaging Review No results found.   EKG Interpretation None      MDM   Final diagnoses:  Abdominal pain, vomiting, and diarrhea    Patient here for evaluation of vomiting, diarrhea, epigastric abdominal pain. On recheck in the emergency department patient is feeling improved and tolerating oral fluids and her pain is resolved. Discussed with patient home care for abdominal pain, vomiting, diarrhea, likely gastroenteritis. Return precautions were discussed and prescription for Zofran provided. Presentation and exam is not consistent with cholecystitis, bowel obstruction, pancreatitis, appendicitis.  Tilden FossaElizabeth Majid Mccravy, MD 01/30/15 1349

## 2015-01-30 NOTE — Discharge Instructions (Signed)

## 2015-02-25 ENCOUNTER — Ambulatory Visit
Admission: RE | Admit: 2015-02-25 | Discharge: 2015-02-25 | Disposition: A | Discharge status: Home / Self Care | Payer: Medicare Other | Source: Ambulatory Visit | Attending: Internal Medicine | Admitting: Internal Medicine

## 2015-02-25 ENCOUNTER — Encounter (INDEPENDENT_AMBULATORY_CARE_PROVIDER_SITE_OTHER): Payer: Self-pay

## 2015-02-25 DIAGNOSIS — N63 Unspecified lump in unspecified breast: Secondary | ICD-10-CM

## 2015-09-11 ENCOUNTER — Other Ambulatory Visit: Payer: Self-pay | Admitting: Internal Medicine

## 2015-09-11 DIAGNOSIS — N6320 Unspecified lump in the left breast, unspecified quadrant: Secondary | ICD-10-CM

## 2015-09-17 ENCOUNTER — Other Ambulatory Visit: Payer: Self-pay | Admitting: Internal Medicine

## 2015-09-17 ENCOUNTER — Ambulatory Visit
Admission: RE | Admit: 2015-09-17 | Discharge: 2015-09-17 | Disposition: A | Discharge status: Home / Self Care | Payer: Medicare Other | Source: Ambulatory Visit | Attending: Internal Medicine | Admitting: Internal Medicine

## 2015-09-17 DIAGNOSIS — N6310 Unspecified lump in the right breast, unspecified quadrant: Secondary | ICD-10-CM

## 2015-09-17 DIAGNOSIS — N6320 Unspecified lump in the left breast, unspecified quadrant: Secondary | ICD-10-CM

## 2016-07-07 ENCOUNTER — Other Ambulatory Visit: Payer: Self-pay | Admitting: Internal Medicine

## 2016-07-07 DIAGNOSIS — E2839 Other primary ovarian failure: Secondary | ICD-10-CM

## 2016-07-07 DIAGNOSIS — N63 Unspecified lump in unspecified breast: Secondary | ICD-10-CM

## 2016-07-15 ENCOUNTER — Other Ambulatory Visit: Payer: Medicare Other

## 2016-07-28 ENCOUNTER — Other Ambulatory Visit: Payer: Medicare Other

## 2016-08-11 ENCOUNTER — Ambulatory Visit: Admission: RE | Admit: 2016-08-11 | Payer: Medicare Other | Source: Ambulatory Visit

## 2016-08-11 ENCOUNTER — Ambulatory Visit
Admission: RE | Admit: 2016-08-11 | Discharge: 2016-08-11 | Disposition: A | Discharge status: Home / Self Care | Payer: Medicare Other | Source: Ambulatory Visit | Attending: Internal Medicine | Admitting: Internal Medicine

## 2016-08-11 DIAGNOSIS — N63 Unspecified lump in unspecified breast: Secondary | ICD-10-CM

## 2016-08-11 DIAGNOSIS — E2839 Other primary ovarian failure: Secondary | ICD-10-CM

## 2017-05-11 ENCOUNTER — Other Ambulatory Visit: Payer: Self-pay | Admitting: Internal Medicine

## 2017-05-11 DIAGNOSIS — R1084 Generalized abdominal pain: Secondary | ICD-10-CM

## 2017-05-11 DIAGNOSIS — R1013 Epigastric pain: Secondary | ICD-10-CM

## 2017-05-20 ENCOUNTER — Ambulatory Visit
Admission: RE | Admit: 2017-05-20 | Discharge: 2017-05-20 | Disposition: A | Discharge status: Home / Self Care | Payer: Medicare Other | Source: Ambulatory Visit | Attending: Internal Medicine | Admitting: Internal Medicine

## 2017-05-20 DIAGNOSIS — R1084 Generalized abdominal pain: Secondary | ICD-10-CM

## 2017-05-20 DIAGNOSIS — R1013 Epigastric pain: Secondary | ICD-10-CM

## 2017-11-03 IMAGING — US US ABDOMEN COMPLETE
1 series · 14 of 25 positions shown · non-contrast
Comparison: None.

CLINICAL DATA: Epigastric abdomen pain.

EXAM:
ABDOMEN ULTRASOUND COMPLETE

[Series 1: us abdomen complete · 0.28mm/px · 14 of 79 slices shown]
[im 1/79]
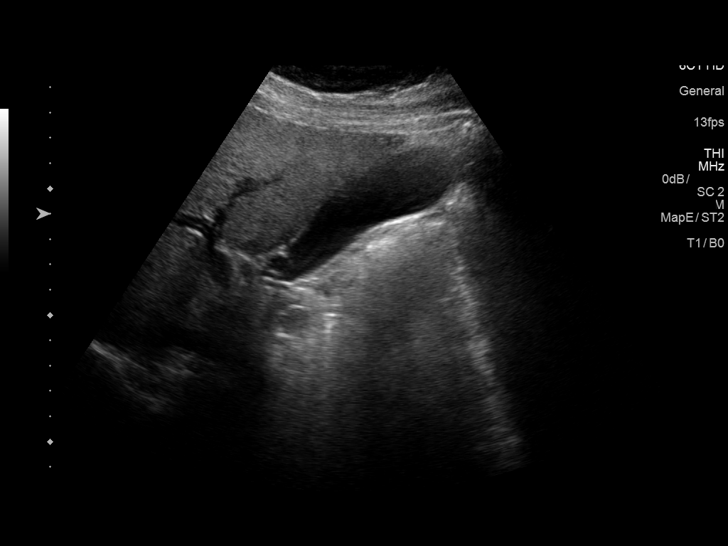
[im 7/79]
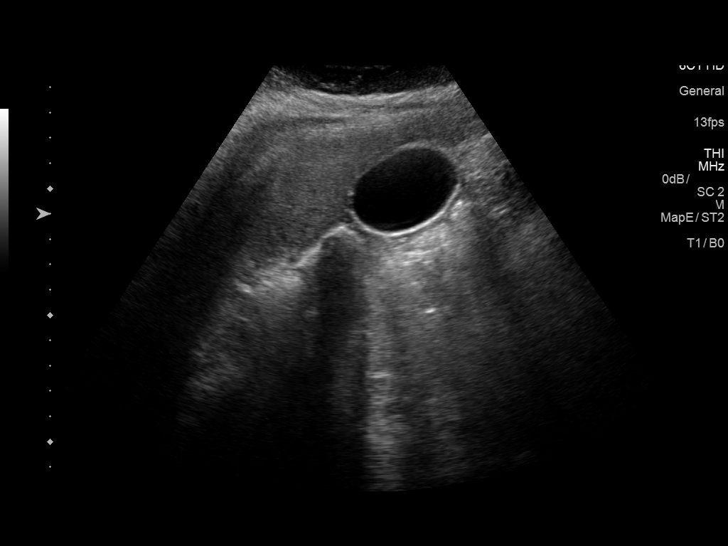
[im 14/79]
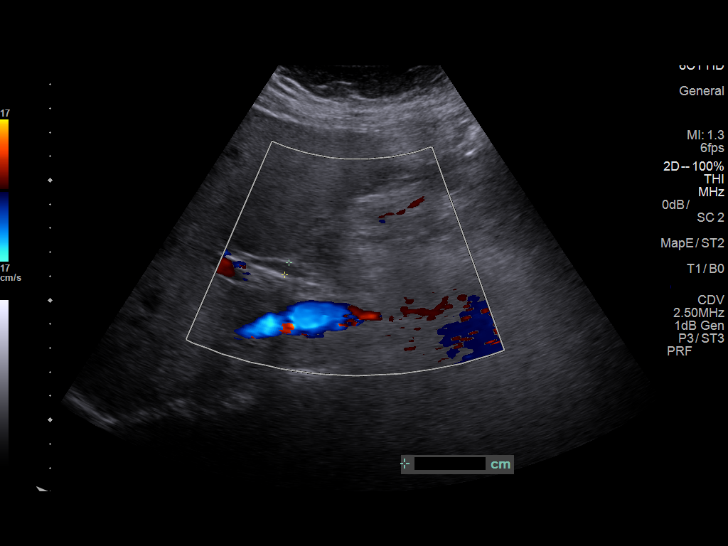
[im 20/79]
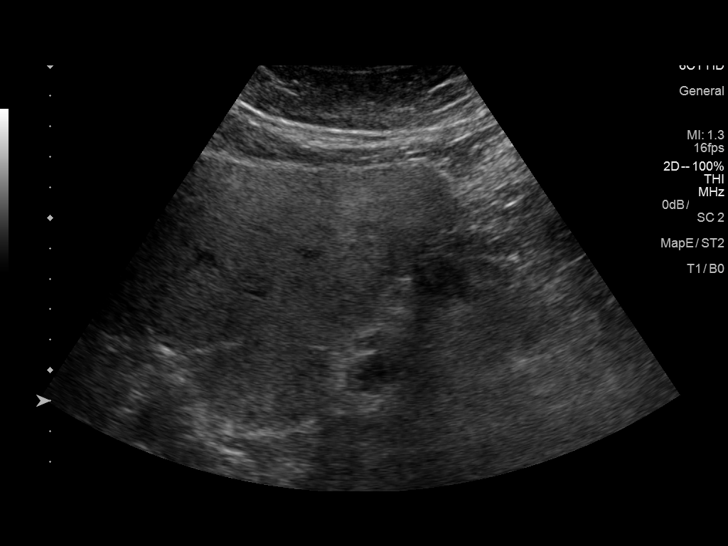
[im 27/79]
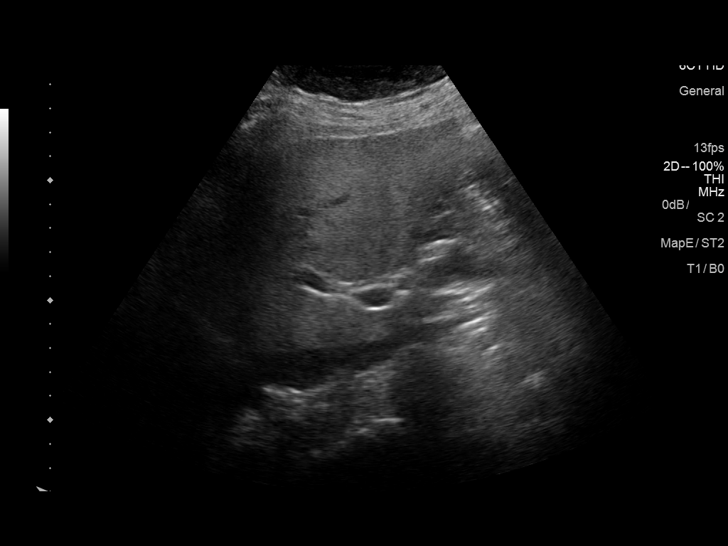
[im 30/79]
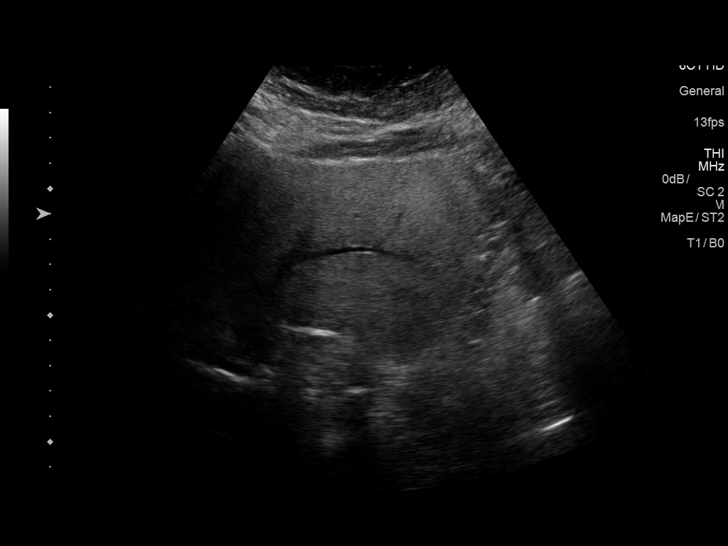
[im 36/79]
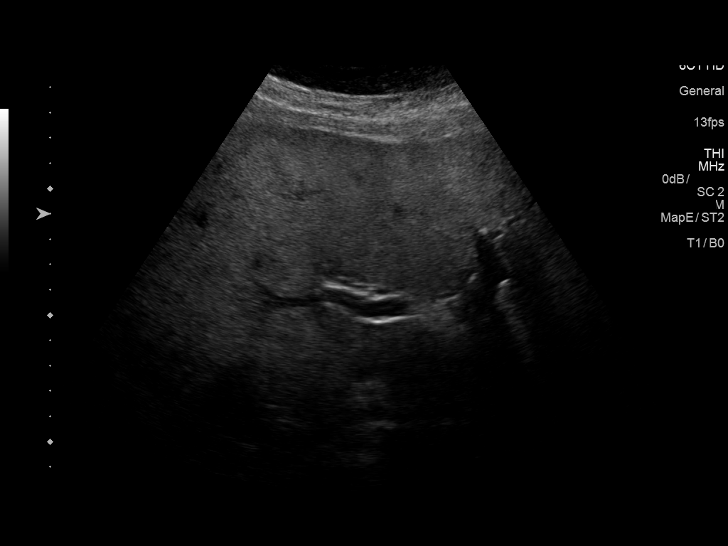
[im 43/79]
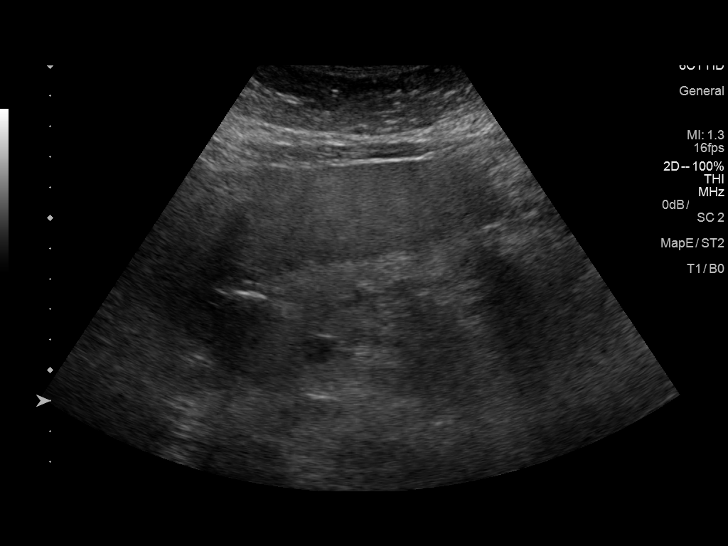
[im 49/79]
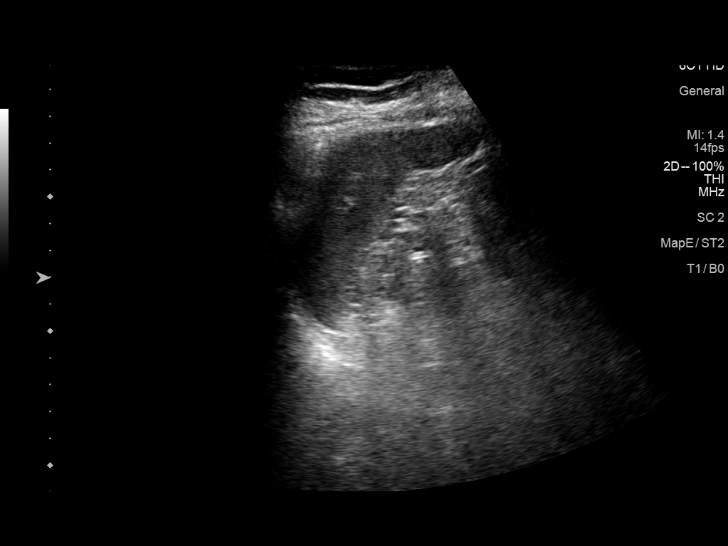
[im 53/79]
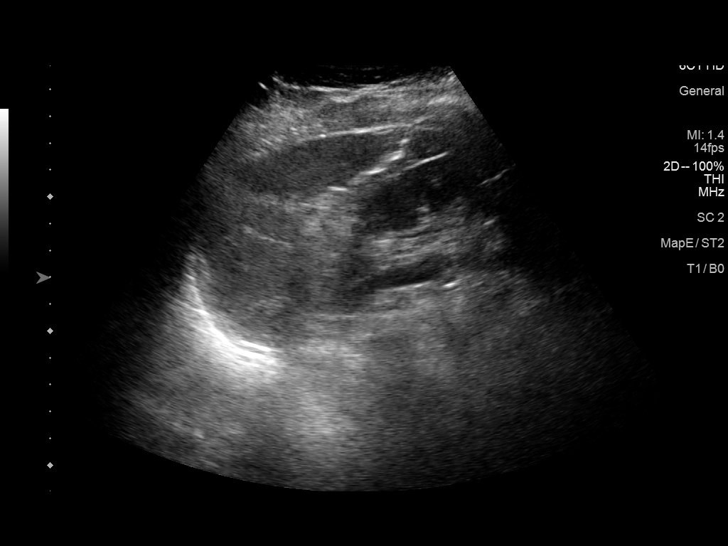
[im 59/79]
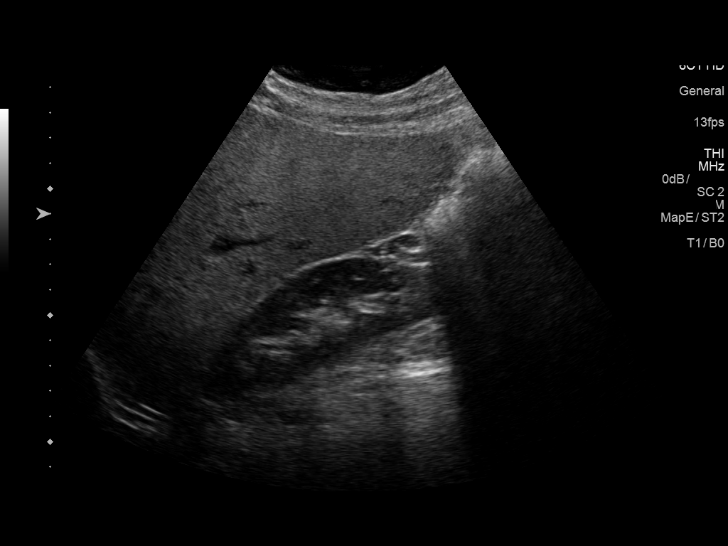
[im 66/79]
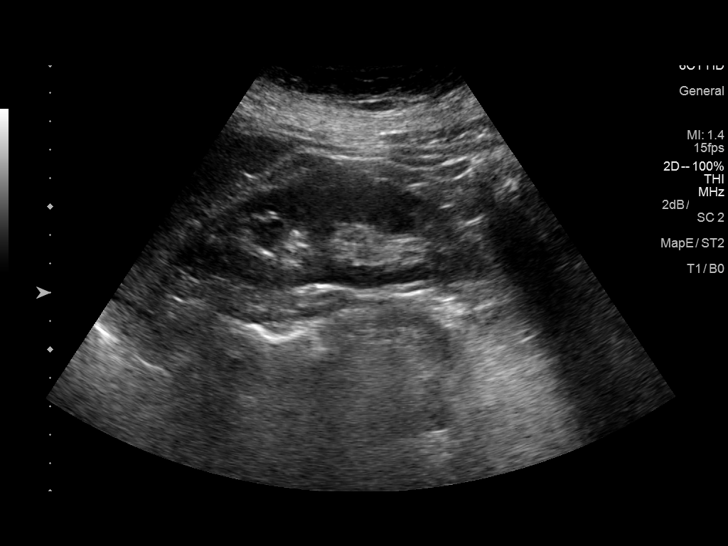
[im 72/79]
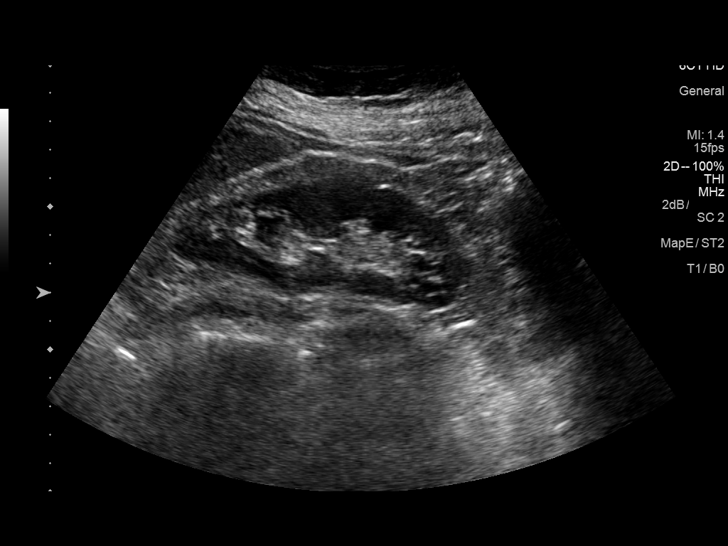
[im 79/79]
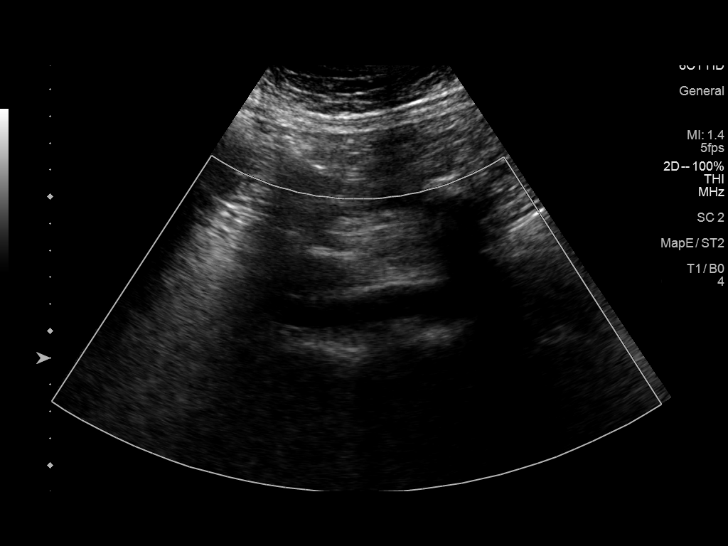

[14 of 25 positions shown; findings below may reference images not displayed]

FINDINGS: Gallbladder: No gallstones or wall thickening visualized. No
sonographic Murphy sign noted by sonographer.

Common bile duct: Diameter: 5.3 mm

Liver: No focal lesion identified. There is diffuse increased
echotexture of the liver.

IVC: No abnormality visualized.

Pancreas: Limited visualization.

Spleen: Size and appearance within normal limits.

Right Kidney: Length: 10.4 cm. Echogenicity within normal limits. No
mass or hydronephrosis visualized.

Left Kidney: Length: 10.5 cm. Echogenicity within normal limits. No
mass or hydronephrosis visualized.

Abdominal aorta: No aneurysm visualized.

Other findings: None.
IMPRESSION: Diffuse increased echotexture of the liver, nonspecific but can be
seen in fatty infiltration of liver.

## 2019-06-22 ENCOUNTER — Other Ambulatory Visit: Payer: Self-pay | Admitting: Internal Medicine

## 2019-06-22 DIAGNOSIS — N6320 Unspecified lump in the left breast, unspecified quadrant: Secondary | ICD-10-CM

## 2019-06-29 ENCOUNTER — Ambulatory Visit
Admission: RE | Admit: 2019-06-29 | Discharge: 2019-06-29 | Disposition: A | Discharge status: Home / Self Care | Payer: Medicare Other | Source: Ambulatory Visit | Attending: Internal Medicine | Admitting: Internal Medicine

## 2019-06-29 ENCOUNTER — Other Ambulatory Visit: Payer: Self-pay

## 2019-06-29 DIAGNOSIS — N6320 Unspecified lump in the left breast, unspecified quadrant: Secondary | ICD-10-CM

## 2019-12-14 ENCOUNTER — Ambulatory Visit: Payer: Medicare Other

## 2019-12-19 ENCOUNTER — Ambulatory Visit: Payer: Medicare Other

## 2019-12-20 ENCOUNTER — Ambulatory Visit: Payer: Medicare PPO | Attending: Internal Medicine

## 2019-12-20 DIAGNOSIS — Z23 Encounter for immunization: Secondary | ICD-10-CM

## 2019-12-20 NOTE — Progress Notes (Signed)
   Covid-19 Vaccination Clinic  Name:  MERYL HUBERS    MRN: [id number] DOB: [birth year]  12/20/2019  Ms. Loud was observed post Covid-19 immunization for 15 minutes without incidence. She was provided with Vaccine Information Sheet and instruction to access the V-Safe system.   Ms. Moder was instructed to call 911 with any severe reactions post vaccine: Marland Kitchen Difficulty breathing  . Swelling of your face and throat  . A fast heartbeat  . A bad rash all over your body  . Dizziness and weakness

## 2020-01-15 ENCOUNTER — Ambulatory Visit: Payer: Medicare PPO | Attending: Internal Medicine

## 2020-01-15 DIAGNOSIS — Z23 Encounter for immunization: Secondary | ICD-10-CM | POA: Insufficient documentation

## 2020-01-15 NOTE — Progress Notes (Signed)
   Covid-19 Vaccination Clinic  Name:  NATALEAH SCIONEAUX    MRN: [id number] DOB: 09-30-1946  01/15/2020  Ms. Sonier was observed post Covid-19 immunization for 15 minutes without incident. She was provided with Vaccine Information Sheet and instruction to access the V-Safe system.   Ms. Mccahill was instructed to call 911 with any severe reactions post vaccine: Marland Kitchen Difficulty breathing  . Swelling of face and throat  . A fast heartbeat  . A bad rash all over body  . Dizziness and weakness   Immunizations Administered    Name Date Dose VIS Date Route   Pfizer COVID-19 Vaccine 01/15/2020  9:54 AM 0.3 mL 10/26/2019 Intramuscular   Manufacturer: [employer], [employer]   Lot: [id number]   NDC: [address]-9

## 2020-04-30 ENCOUNTER — Other Ambulatory Visit: Payer: Medicare PPO

## 2020-04-30 ENCOUNTER — Ambulatory Visit: Payer: Medicare PPO | Attending: Internal Medicine

## 2020-04-30 DIAGNOSIS — Z20822 Contact with and (suspected) exposure to covid-19: Secondary | ICD-10-CM

## 2020-05-01 LAB — SARS-COV-2, NAA 2 DAY TAT

## 2020-05-01 LAB — NOVEL CORONAVIRUS, NAA: SARS-CoV-2, NAA: NOT DETECTED

## 2021-05-12 ENCOUNTER — Other Ambulatory Visit: Payer: Self-pay

## 2021-05-12 ENCOUNTER — Other Ambulatory Visit: Payer: Self-pay | Admitting: Internal Medicine

## 2021-05-12 ENCOUNTER — Ambulatory Visit
Admission: RE | Admit: 2021-05-12 | Discharge: 2021-05-12 | Disposition: A | Discharge status: Home / Self Care | Payer: Medicare PPO | Source: Ambulatory Visit | Attending: Internal Medicine | Admitting: Internal Medicine

## 2021-05-12 DIAGNOSIS — M79604 Pain in right leg: Secondary | ICD-10-CM

## 2021-07-30 ENCOUNTER — Other Ambulatory Visit: Payer: Self-pay | Admitting: Internal Medicine

## 2021-07-30 DIAGNOSIS — Z1231 Encounter for screening mammogram for malignant neoplasm of breast: Secondary | ICD-10-CM

## 2021-08-04 ENCOUNTER — Ambulatory Visit
Admission: RE | Admit: 2021-08-04 | Discharge: 2021-08-04 | Disposition: A | Discharge status: Home / Self Care | Payer: Medicare PPO | Source: Ambulatory Visit | Attending: Internal Medicine | Admitting: Internal Medicine

## 2021-08-04 ENCOUNTER — Other Ambulatory Visit: Payer: Self-pay

## 2021-08-04 DIAGNOSIS — Z1231 Encounter for screening mammogram for malignant neoplasm of breast: Secondary | ICD-10-CM

## 2021-09-20 ENCOUNTER — Encounter (HOSPITAL_BASED_OUTPATIENT_CLINIC_OR_DEPARTMENT_OTHER): Payer: Self-pay | Admitting: Emergency Medicine

## 2021-09-20 ENCOUNTER — Other Ambulatory Visit: Payer: Self-pay

## 2021-09-20 ENCOUNTER — Emergency Department (HOSPITAL_BASED_OUTPATIENT_CLINIC_OR_DEPARTMENT_OTHER)
Admission: EM | Admit: 2021-09-20 | Discharge: 2021-09-20 | Disposition: A | Discharge status: Home / Self Care | Payer: Medicare PPO | Attending: Emergency Medicine | Admitting: Emergency Medicine

## 2021-09-20 DIAGNOSIS — Z7984 Long term (current) use of oral hypoglycemic drugs: Secondary | ICD-10-CM | POA: Diagnosis not present

## 2021-09-20 DIAGNOSIS — R42 Dizziness and giddiness: Secondary | ICD-10-CM | POA: Diagnosis present

## 2021-09-20 DIAGNOSIS — E119 Type 2 diabetes mellitus without complications: Secondary | ICD-10-CM | POA: Insufficient documentation

## 2021-09-20 LAB — CBC
HCT: 37.6 % (ref 36.0–46.0)
Hemoglobin: 12.6 g/dL (ref 12.0–15.0)
MCH: 32.1 pg (ref 26.0–34.0)
MCHC: 33.5 g/dL (ref 30.0–36.0)
MCV: 95.7 fL (ref 80.0–100.0)
Platelets: 193 10*3/uL (ref 150–400)
RBC: 3.93 MIL/uL (ref 3.87–5.11)
RDW: 13.6 % (ref 11.5–15.5)
WBC: 7.7 10*3/uL (ref 4.0–10.5)
nRBC: 0 % (ref 0.0–0.2)

## 2021-09-20 LAB — BASIC METABOLIC PANEL
Anion gap: 7 (ref 5–15)
BUN: 20 mg/dL (ref 8–23)
CO2: 26 mmol/L (ref 22–32)
Calcium: 8.8 mg/dL — ABNORMAL LOW (ref 8.9–10.3)
Chloride: 105 mmol/L (ref 98–111)
Creatinine, Ser: 1 mg/dL (ref 0.44–1.00)
GFR, Estimated: 59 mL/min — ABNORMAL LOW (ref >60)
Glucose, Bld: 129 mg/dL — ABNORMAL HIGH (ref 70–99)
Potassium: 4 mmol/L (ref 3.5–5.1)
Sodium: 138 mmol/L (ref 135–145)

## 2021-09-20 LAB — CBG MONITORING, ED: Glucose-Capillary: 107 mg/dL — ABNORMAL HIGH (ref 70–99)

## 2021-09-20 MED ORDER — ONDANSETRON HCL 4 MG/2ML IJ SOLN
4.0000 mg | Freq: Once | INTRAMUSCULAR | Status: AC | PRN
  Administered 2021-09-20: 4 mg via INTRAVENOUS
  Filled 2021-09-20: qty 2

## 2021-09-20 NOTE — ED Notes (Signed)
ED Provider at bedside. 

## 2021-09-20 NOTE — ED Triage Notes (Signed)
Reports she woke up to go to the bathroom at 0200 and the room was spinning. Endorses being dizzy since with n/v. Hx of veritgo in the past.

## 2021-09-20 NOTE — Discharge Instructions (Addendum)
Please follow-up with your primary care doctor to discuss the episode of dizziness that you experienced today.  Return to ER if you develop worsening dizziness, chest pain, difficulty breathing, numbness, weakness, speech or vision changes or other new concerning symptoms.

## 2021-09-20 NOTE — ED Triage Notes (Signed)
BIB EMS from home due to n/v/d and dizziness since 0200. Pt reported a hx of vertigo. Per EMS HR 64, BP 138/60, CBG 150. EMS states no vomiting or heaving in their presence.

## 2021-09-21 NOTE — ED Provider Notes (Signed)
[facility] Provider Note   CSN: [id number] Arrival date & time: 09/20/21  [id number]     History Chief Complaint  Patient presents with   Dizziness    Andrea Doyle is a 75 y.o. female.  Presents to ER with concern for dizziness.  Reports that she has prior history of vertigo episodes but has been a long time since she has had an episode.  Woke up this morning and felt very dizzy, room spinning sensation.  It seemed to be worse with any sudden movements and improved with rest.  After she got to the emergency room, the dizziness completely resolved.  She denies any ongoing dizziness or lightheadedness.  Patient also reports that she did not have any associated speech changes, vision changes, numbness, weakness, chest pain or difficulty breathing.  No ear pain.  No recent fevers.  Yesterday no symptoms.  History of diabetes, hyperlipidemia.  HPI     Past Medical History:  Diagnosis Date   Diabetes mellitus    High cholesterol     There are no problems to display for this patient.   No past surgical history on file.   OB History   No obstetric history on file.     Family History  Problem Relation Age of Onset   Alzheimer's disease Mother    Alzheimer's disease Father     Social History   Tobacco Use   Smoking status: Never   Smokeless tobacco: Never  Substance Use Topics   Alcohol use: No   Drug use: No    Home Medications Prior to Admission medications   Medication Sig Start Date End Date Taking? Authorizing Provider  aspirin 325 MG tablet Take 650 mg by mouth every 4 (four) hours as needed for mild pain.    [provider]  bismuth subsalicylate (PEPTO BISMOL) 262 MG/15ML suspension Take 30 mLs by mouth every 6 (six) hours as needed for diarrhea or loose stools.    [provider]  Calcium Carbonate-Vitamin D (CALCIUM 600+D) 600-200 MG-UNIT TABS Take 1 tablet by mouth daily.    [provider]   cetirizine (ZYRTEC) 10 MG tablet Take 10 mg by mouth daily as needed for allergies.    [provider]  chlorthalidone (HYGROTON) 25 MG tablet Take 25 mg by mouth as needed (fluid).    [provider]  fish oil-omega-3 fatty acids 1000 MG capsule Take 1 g by mouth daily.    [provider]  meclizine (ANTIVERT) 50 MG tablet Take 1 tablet (50 mg total) by mouth 3 (three) times daily as needed. 06/21/14   Montine Circle, PA-C  metFORMIN (GLUCOPHAGE) 500 MG tablet Take 500 mg by mouth 2 (two) times daily with a meal.    [provider]  Multiple Vitamin (MULITIVITAMIN WITH MINERALS) TABS Take 1 tablet by mouth daily.    [provider]  ondansetron (ZOFRAN) 4 MG tablet Take 1 tablet (4 mg total) by mouth every 6 (six) hours. 01/30/15   Quintella Reichert, MD  simvastatin (ZOCOR) 20 MG tablet Take 20 mg by mouth at bedtime.    [provider]  vitamin B-12 (CYANOCOBALAMIN) 1000 MCG tablet Take 1,000 mcg by mouth daily.    [provider]    Allergies    Codeine and Penicillins  Review of Systems   Review of Systems  Constitutional:  Negative for chills and fever.  HENT:  Negative for ear pain and sore throat.   Eyes:  Negative  for pain and visual disturbance.  Respiratory:  Negative for cough and shortness of breath.   Cardiovascular:  Negative for chest pain and palpitations.  Gastrointestinal:  Negative for abdominal pain and vomiting.  Genitourinary:  Negative for dysuria and hematuria.  Musculoskeletal:  Negative for arthralgias and back pain.  Skin:  Negative for color change and rash.  Neurological:  Positive for dizziness. Negative for seizures and syncope.  All other systems reviewed and are negative.  Physical Exam Updated Vital Signs BP 124/64   Pulse (!) 53   Temp 98 F (36.7 C) (Oral)   Resp 17   Ht 5\' 4"  (1.626 m)   Wt 83.9 kg   SpO2 100%   BMI 31.76 kg/m   Physical Exam Vitals and nursing note reviewed.   Constitutional:      General: She is not in acute distress.    Appearance: She is well-developed.  HENT:     Head: Normocephalic and atraumatic.     Right Ear: Tympanic membrane, ear canal and external ear normal.     Left Ear: Tympanic membrane, ear canal and external ear normal.  Eyes:     Conjunctiva/sclera: Conjunctivae normal.  Cardiovascular:     Rate and Rhythm: Normal rate and regular rhythm.     Heart sounds: No murmur heard. Pulmonary:     Effort: Pulmonary effort is normal. No respiratory distress.     Breath sounds: Normal breath sounds.  Abdominal:     Palpations: Abdomen is soft.     Tenderness: There is no abdominal tenderness.  Musculoskeletal:     Cervical back: Neck supple.  Skin:    General: Skin is warm and dry.  Neurological:     Mental Status: She is alert.     Comments: AAOx3 CN 2-12 intact, speech clear visual fields intact 5/5 strength in b/l UE and LE Sensation to light touch intact in b/l UE and LE Normal FNF Normal gait    ED Results / Procedures / Treatments   Labs (all labs ordered are listed, but only abnormal results are displayed) Labs Reviewed  BASIC METABOLIC PANEL - Abnormal; Notable for the following components:      Result Value   Glucose, Bld 129 (*)    Calcium 8.8 (*)    GFR, Estimated 59 (*)    All other components within normal limits  CBG MONITORING, ED - Abnormal; Notable for the following components:   Glucose-Capillary 107 (*)    All other components within normal limits  CBC    EKG EKG Interpretation  Date/Time:  Sunday September 20 2021 05:53:46 EST Ventricular Rate:  57 PR Interval:  174 QRS Duration: 72 QT Interval:  456 QTC Calculation: 443 R Axis:   53 Text Interpretation: Sinus bradycardia Otherwise normal ECG No old tracing to compare Confirmed by Belfi, Melanie ([id number]) on 09/21/2021 9:09:21 AM  Radiology No results found.  Procedures Procedures   Medications Ordered in ED Medications   ondansetron (ZOFRAN) injection 4 mg (4 mg Intravenous Given 09/20/21 0602)    ED Course  I have reviewed the triage vital signs and the nursing notes.  Pertinent labs & imaging results that were available during my care of the patient were reviewed by me and considered in my medical decision making (see chart for details).    MDM Rules/Calculators/A&P                         75  year old presents to ER  after having episode of vertigo.  Describes room spinning sensation.  Worse with positions, improved with rest.  When I evaluated patient, her symptoms have completely resolved and she had a normal neurologic exam, well on ambulation trial in room.  Given no associated neurologic complaint, normal neuro exam and no ongoing symptoms, doubt central cause and suspect much more likely peripheral cause for her vertigo today.  Her basic labs were grossly stable.  EKG unremarkable.  Vital stable.  Recommend that she follow-up with her primary care doctor to discuss this episode, discharged home.  After the discussed management above, the patient was determined to be safe for discharge.  The patient was in agreement with this plan and all questions regarding their care were answered.  ED return precautions were discussed and the patient will return to the ED with any significant worsening of condition.    Final Clinical Impression(s) / ED Diagnoses Final diagnoses:  Dizziness    Rx / DC Orders ED Discharge Orders     None        Lucrezia Starch, MD 09/21/21 [phone]

## 2022-06-17 ENCOUNTER — Other Ambulatory Visit: Payer: Self-pay | Admitting: Internal Medicine

## 2022-06-17 ENCOUNTER — Ambulatory Visit
Admission: RE | Admit: 2022-06-17 | Discharge: 2022-06-17 | Disposition: A | Discharge status: Home / Self Care | Payer: Medicare PPO | Source: Ambulatory Visit | Attending: Internal Medicine | Admitting: Internal Medicine

## 2022-06-17 DIAGNOSIS — R0602 Shortness of breath: Secondary | ICD-10-CM

## 2022-08-18 IMAGING — MG MM DIGITAL SCREENING BILAT W/ TOMO AND CAD
8 series · 8 of 24 positions shown · non-contrast
Comparison: Previous exam(s).

CLINICAL DATA: Screening.

EXAM:
DIGITAL SCREENING BILATERAL MAMMOGRAM WITH TOMOSYNTHESIS AND CAD
TECHNIQUE: Bilateral screening digital craniocaudal and mediolateral oblique
mammograms were obtained. Bilateral screening digital breast
tomosynthesis was performed. The images were evaluated with
computer-aided detection.

[L CC synth-2D]
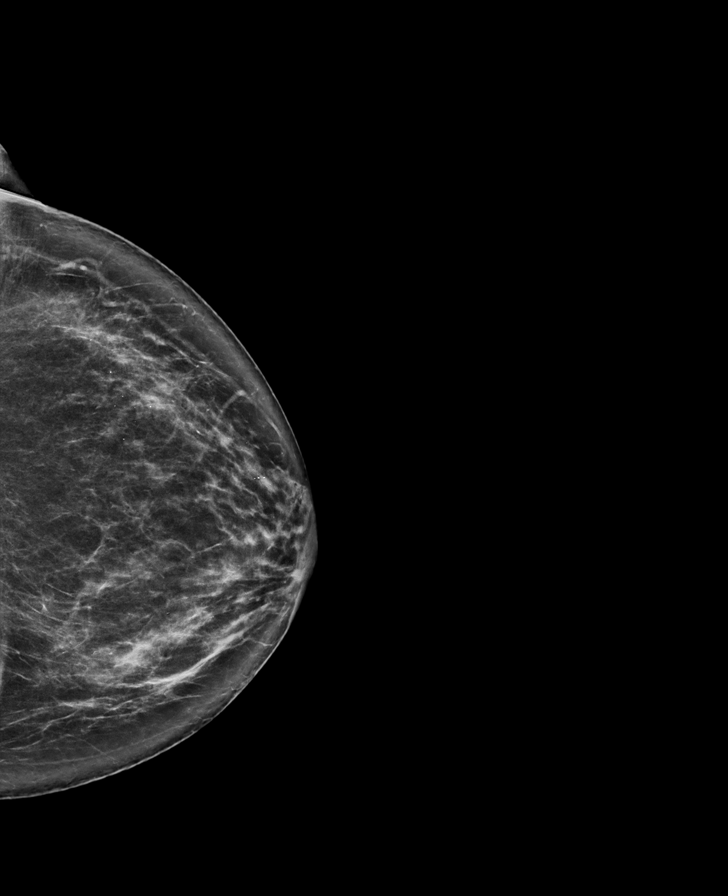

[R CC synth-2D]
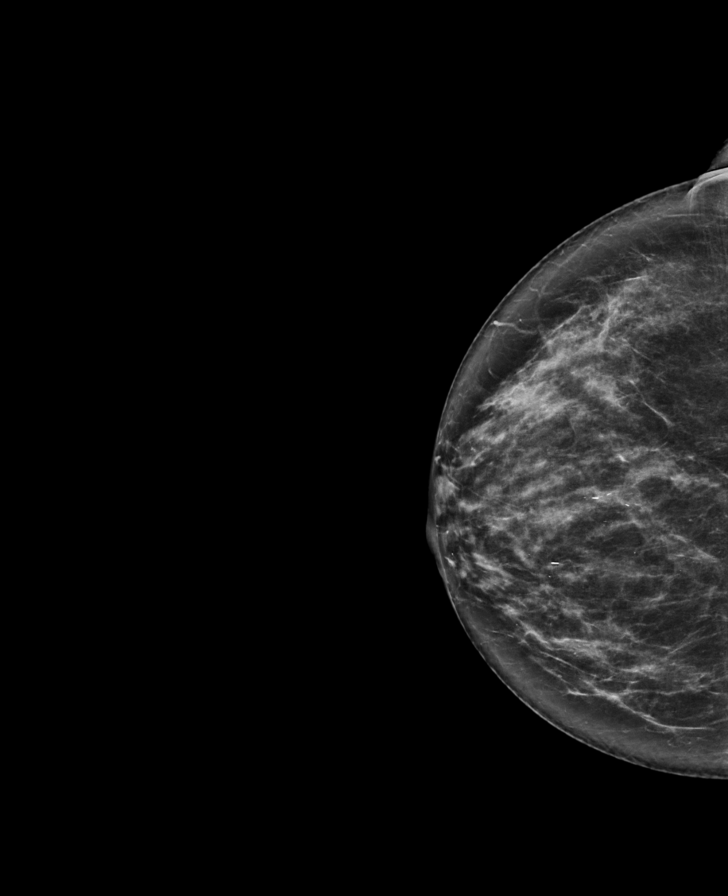

[L MLO synth-2D]
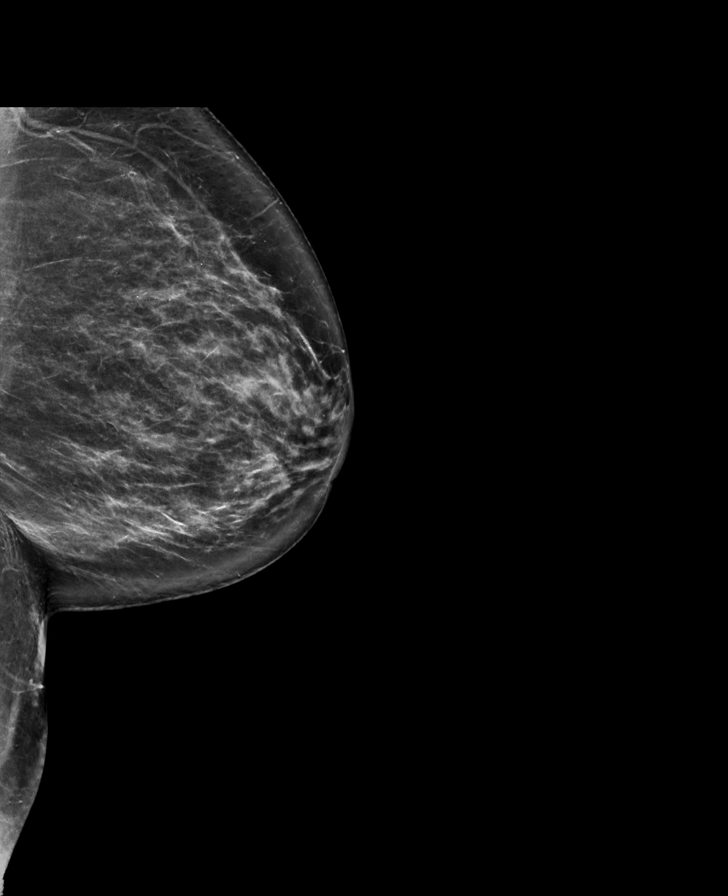

[R MLO synth-2D]
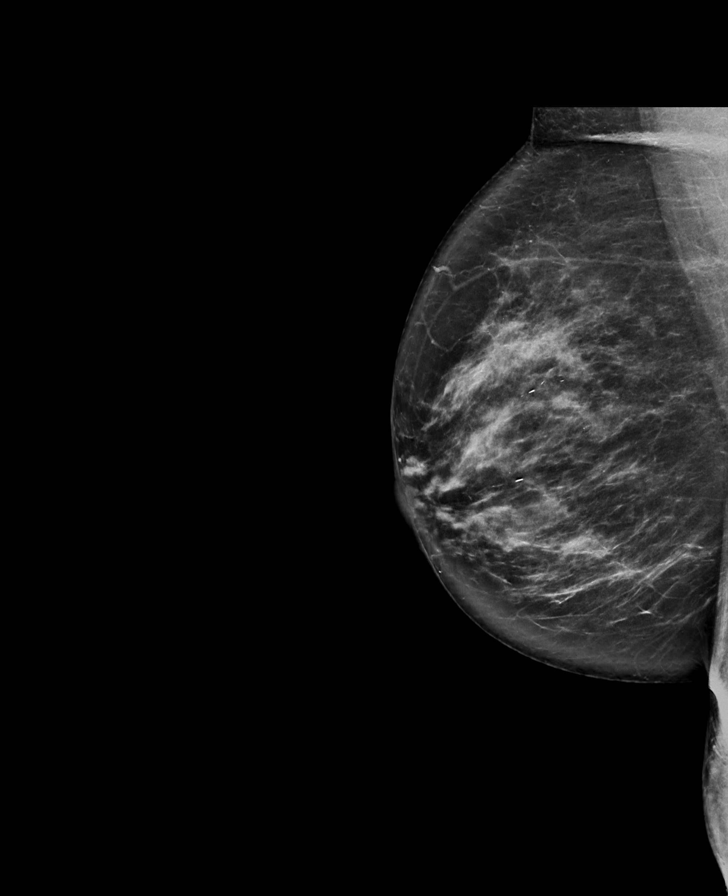

[L MLO tomo · tomo slice 41/82.0]
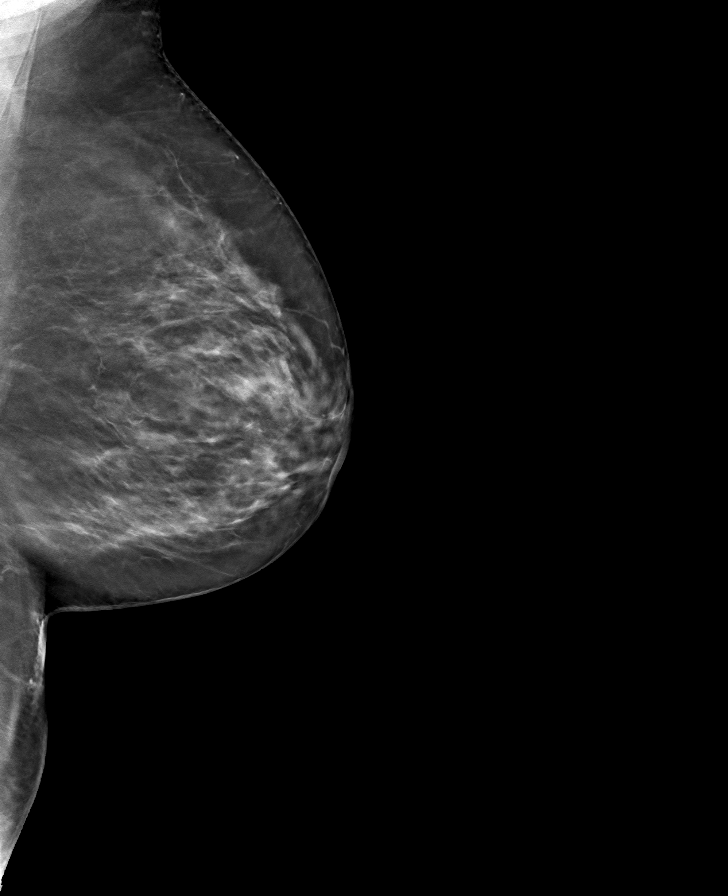

[R CC tomo · tomo slice 37/74.0]
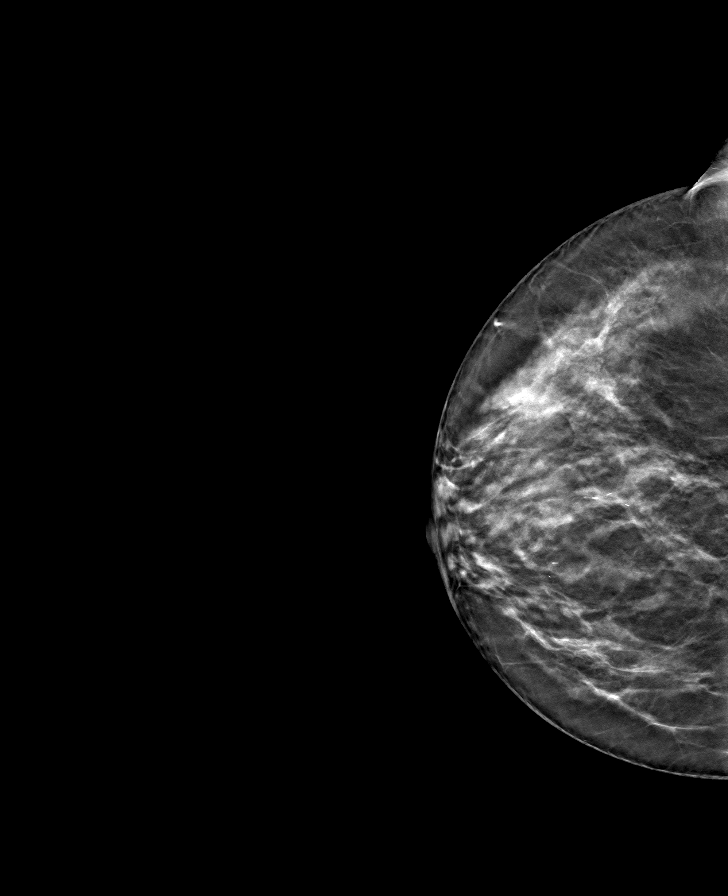

[R MLO tomo · tomo slice 41/82.0]
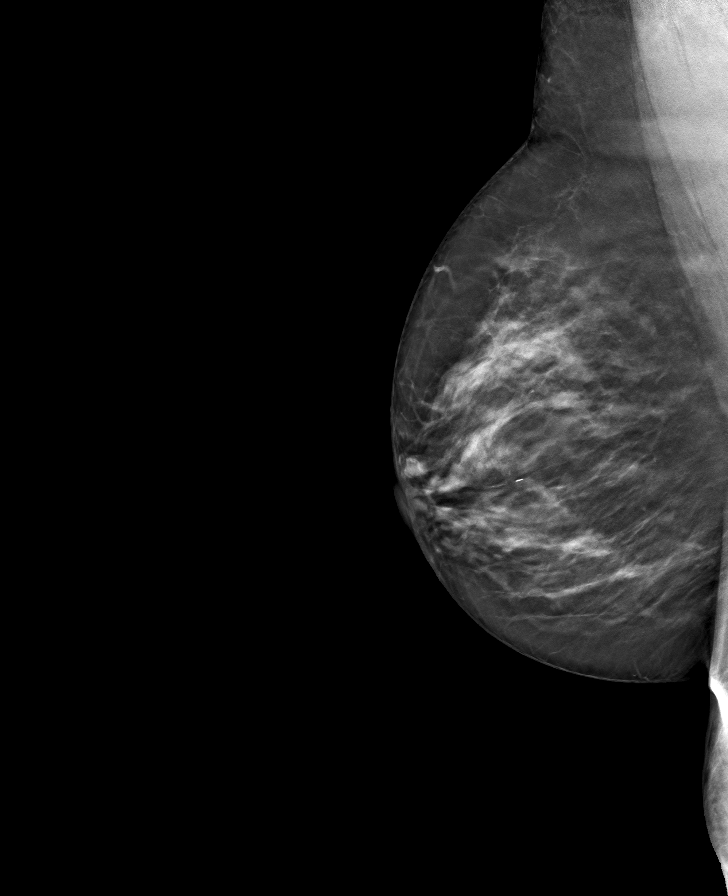

[L CC tomo · tomo slice 39/77.0]
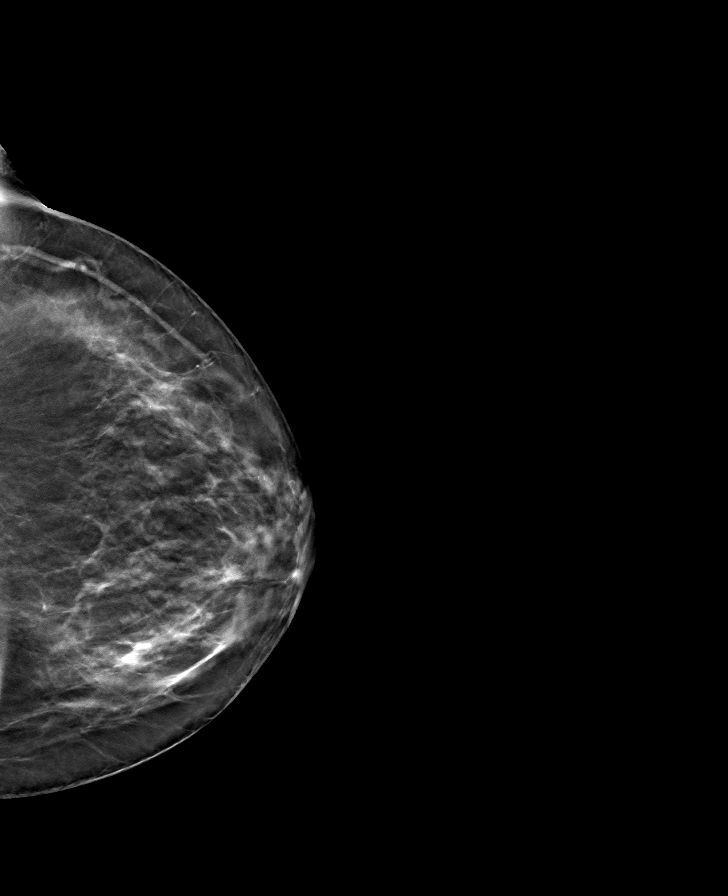

[8 of 24 positions shown; findings below may reference images not displayed]

ACR Breast Density Category c: The breast tissue is heterogeneously
dense, which may obscure small masses.
FINDINGS: There are no findings suspicious for malignancy.
IMPRESSION: No mammographic evidence of malignancy. A result letter of this
screening mammogram will be mailed directly to the patient.

RECOMMENDATION:
Screening mammogram in one year. (Code:Q3-W-BC3)

BI-RADS CATEGORY  1: Negative.

## 2022-08-29 ENCOUNTER — Emergency Department (HOSPITAL_COMMUNITY): Payer: Medicare PPO

## 2022-08-29 ENCOUNTER — Other Ambulatory Visit: Payer: Self-pay

## 2022-08-29 ENCOUNTER — Emergency Department (HOSPITAL_COMMUNITY)
Admission: EM | Admit: 2022-08-29 | Discharge: 2022-08-30 | Disposition: A | Discharge status: Home / Self Care | Payer: Medicare PPO | Attending: Emergency Medicine | Admitting: Emergency Medicine

## 2022-08-29 ENCOUNTER — Encounter (HOSPITAL_COMMUNITY): Payer: Self-pay | Admitting: Emergency Medicine

## 2022-08-29 DIAGNOSIS — S0231XA Fracture of orbital floor, right side, initial encounter for closed fracture: Secondary | ICD-10-CM | POA: Insufficient documentation

## 2022-08-29 DIAGNOSIS — Z7982 Long term (current) use of aspirin: Secondary | ICD-10-CM | POA: Diagnosis not present

## 2022-08-29 DIAGNOSIS — W108XXA Fall (on) (from) other stairs and steps, initial encounter: Secondary | ICD-10-CM | POA: Insufficient documentation

## 2022-08-29 DIAGNOSIS — S0993XA Unspecified injury of face, initial encounter: Secondary | ICD-10-CM | POA: Diagnosis present

## 2022-08-29 DIAGNOSIS — S022XXA Fracture of nasal bones, initial encounter for closed fracture: Secondary | ICD-10-CM

## 2022-08-29 DIAGNOSIS — W19XXXA Unspecified fall, initial encounter: Secondary | ICD-10-CM

## 2022-08-29 DIAGNOSIS — Z79899 Other long term (current) drug therapy: Secondary | ICD-10-CM | POA: Insufficient documentation

## 2022-08-29 DIAGNOSIS — S0230XA Fracture of orbital floor, unspecified side, initial encounter for closed fracture: Secondary | ICD-10-CM

## 2022-08-29 DIAGNOSIS — E041 Nontoxic single thyroid nodule: Secondary | ICD-10-CM

## 2022-08-29 DIAGNOSIS — R109 Unspecified abdominal pain: Secondary | ICD-10-CM | POA: Insufficient documentation

## 2022-08-29 DIAGNOSIS — E042 Nontoxic multinodular goiter: Secondary | ICD-10-CM | POA: Diagnosis not present

## 2022-08-29 LAB — CBC WITH DIFFERENTIAL/PLATELET
Abs Immature Granulocytes: 0.06 10*3/uL (ref 0.00–0.07)
Basophils Absolute: 0 10*3/uL (ref 0.0–0.1)
Basophils Relative: 0 %
Eosinophils Absolute: 0.1 10*3/uL (ref 0.0–0.5)
Eosinophils Relative: 1 %
HCT: 40.2 % (ref 36.0–46.0)
Hemoglobin: 13.4 g/dL (ref 12.0–15.0)
Immature Granulocytes: 1 %
Lymphocytes Relative: 13 %
Lymphs Abs: 1.4 10*3/uL (ref 0.7–4.0)
MCH: 31.7 pg (ref 26.0–34.0)
MCHC: 33.3 g/dL (ref 30.0–36.0)
MCV: 95 fL (ref 80.0–100.0)
Monocytes Absolute: 0.5 10*3/uL (ref 0.1–1.0)
Monocytes Relative: 5 %
Neutro Abs: 8.3 10*3/uL — ABNORMAL HIGH (ref 1.7–7.7)
Neutrophils Relative %: 80 %
Platelets: 224 10*3/uL (ref 150–400)
RBC: 4.23 MIL/uL (ref 3.87–5.11)
RDW: 13.2 % (ref 11.5–15.5)
WBC: 10.5 10*3/uL (ref 4.0–10.5)
nRBC: 0 % (ref 0.0–0.2)

## 2022-08-29 LAB — I-STAT CHEM 8, ED
BUN: 15 mg/dL (ref 8–23)
Calcium, Ion: 1.04 mmol/L — ABNORMAL LOW (ref 1.15–1.40)
Chloride: 104 mmol/L (ref 98–111)
Creatinine, Ser: 0.6 mg/dL (ref 0.44–1.00)
Glucose, Bld: 122 mg/dL — ABNORMAL HIGH (ref 70–99)
HCT: 40 % (ref 36.0–46.0)
Hemoglobin: 13.6 g/dL (ref 12.0–15.0)
Potassium: 5.8 mmol/L — ABNORMAL HIGH (ref 3.5–5.1)
Sodium: 138 mmol/L (ref 135–145)
TCO2: 25 mmol/L (ref 22–32)

## 2022-08-29 LAB — BASIC METABOLIC PANEL
Anion gap: 8 (ref 5–15)
BUN: 13 mg/dL (ref 8–23)
CO2: 23 mmol/L (ref 22–32)
Calcium: 8.8 mg/dL — ABNORMAL LOW (ref 8.9–10.3)
Chloride: 107 mmol/L (ref 98–111)
Creatinine, Ser: 0.72 mg/dL (ref 0.44–1.00)
GFR, Estimated: >60 mL/min (ref >60)
Glucose, Bld: 129 mg/dL — ABNORMAL HIGH (ref 70–99)
Potassium: 3.4 mmol/L — ABNORMAL LOW (ref 3.5–5.1)
Sodium: 138 mmol/L (ref 135–145)

## 2022-08-29 MED ORDER — ACETAMINOPHEN 325 MG PO TABS
650.0000 mg | ORAL_TABLET | Freq: Once | ORAL | Status: AC
  Administered 2022-08-29: 650 mg via ORAL
  Filled 2022-08-29: qty 2

## 2022-08-29 MED ORDER — IOHEXOL 300 MG/ML  SOLN
100.0000 mL | Freq: Once | INTRAMUSCULAR | Status: AC | PRN
  Administered 2022-08-29: 100 mL via INTRAVENOUS

## 2022-08-29 NOTE — ED Provider Triage Note (Signed)
Emergency Medicine Provider Triage Evaluation Note  Andrea Doyle , a 76 y.o. female  was evaluated in triage.  Patient complains of a fall.  Says that she lost her balance and fell down some stairs.  Unable to tell me how many.  Hit her head but denies LOC.  No blood thinners.  Is positive it was mechanical.  Denies seizure, syncope or arrhythmia history.  Does report diabetic.  We will fingerstick  Review of Systems  Positive: Headache, neck pain Negative: Thinners  Physical Exam  BP 129/76 (BP Location: Right Arm)   Pulse 67   Temp 97.7 F (36.5 C) (Oral)   Resp 16   Ht 5\' 4"  (1.626 m)   Wt 97.5 kg   SpO2 100%   BMI 36.90 kg/m  Gen:   Awake, no distress   Resp:  Normal effort  MSK:   Moves extremities without difficulty .  Moving all extremities, following commands.  Normal strength in bilateral upper and lower Other:  No deformities to the face however blood noted in the naris, right more than left.  No noted trauma in the oropharynx.  No scalp lacerations.  Midline cervical spine tenderness  Medical Decision Making  Medically screening exam initiated at 5:46 PM.  Appropriate orders placed.  Andrea Doyle was informed that the remainder of the evaluation will be completed by another provider, this initial triage assessment does not replace that evaluation, and the importance of remaining in the ED until their evaluation is complete.     Rhae Hammock, PA-C 08/29/22 1748

## 2022-08-29 NOTE — Discharge Instructions (Signed)
You were seen in the emergency department for evaluation of injuries from a fall.  You had a CAT scan of your head and neck face chest abdomen and pelvis.  You have a fracture of the floor of the orbit on the left and a nasal fracture.  You also had an incidental thyroid nodule that will need follow-up.  Please use ice to the affected area and Tylenol as needed for pain.  Follow-up with Dr. Erin Hearing this week regarding your facial fractures.  Your primary care doctor can help you get set up for an ultrasound of your thyroid.  Return to the emergency department if any worsening or concerning symptoms.

## 2022-08-29 NOTE — ED Notes (Signed)
Patient transported to CT 

## 2022-08-29 NOTE — ED Provider Notes (Signed)
[facility]-EMERGENCY DEPT Provider Note   CSN: [id number] Arrival date & time: 08/29/22  1705     History {Add pertinent medical, surgical, social history, OB history to HPI:1} Chief Complaint  Patient presents with   Andrea Doyle is a 76 y.o. female.  She said she was walking down some stairs when she became dizzy and fell.  She hit her head she is not sure if she lost consciousness.  Complaining of headache and facial pain and pain down the left side of her body including her abdomen.  She has had no nausea or vomiting no blurry vision or double vision.  She is not on any blood thinners other than an aspirin.  The history is provided by the patient.  Fall This is a new problem. The problem has not changed since onset.Associated symptoms include abdominal pain and headaches. Pertinent negatives include no chest pain and no shortness of breath. The symptoms are aggravated by bending and twisting. Nothing relieves the symptoms. She has tried nothing for the symptoms. The treatment provided no relief.       Home Medications Prior to Admission medications   Medication Sig Start Date End Date Taking? Authorizing Provider  aspirin 325 MG tablet Take 650 mg by mouth every 4 (four) hours as needed for mild pain.    [provider]  bismuth subsalicylate (PEPTO BISMOL) 262 MG/15ML suspension Take 30 mLs by mouth every 6 (six) hours as needed for diarrhea or loose stools.    [provider]  Calcium Carbonate-Vitamin D (CALCIUM 600+D) 600-200 MG-UNIT TABS Take 1 tablet by mouth daily.    [provider]  cetirizine (ZYRTEC) 10 MG tablet Take 10 mg by mouth daily as needed for allergies.    [provider]  chlorthalidone (HYGROTON) 25 MG tablet Take 25 mg by mouth as needed (fluid).    [provider]  fish oil-omega-3 fatty acids 1000 MG capsule Take 1 g by mouth daily.    [provider]  meclizine  (ANTIVERT) 50 MG tablet Take 1 tablet (50 mg total) by mouth 3 (three) times daily as needed. 06/21/14   Roxy Horseman, PA-C  metFORMIN (GLUCOPHAGE) 500 MG tablet Take 500 mg by mouth 2 (two) times daily with a meal.    [provider]  Multiple Vitamin (MULITIVITAMIN WITH MINERALS) TABS Take 1 tablet by mouth daily.    [provider]  ondansetron (ZOFRAN) 4 MG tablet Take 1 tablet (4 mg total) by mouth every 6 (six) hours. 01/30/15   Tilden Fossa, MD  simvastatin (ZOCOR) 20 MG tablet Take 20 mg by mouth at bedtime.    [provider]  vitamin B-12 (CYANOCOBALAMIN) 1000 MCG tablet Take 1,000 mcg by mouth daily.    [provider]      Allergies    Codeine and Penicillins    Review of Systems   Review of Systems  Constitutional:  Negative for fever.  HENT:  Positive for facial swelling and nosebleeds.   Eyes:  Negative for visual disturbance.  Respiratory:  Negative for shortness of breath.   Cardiovascular:  Negative for chest pain.  Gastrointestinal:  Positive for abdominal pain.  Genitourinary:  Negative for dysuria.  Musculoskeletal:  Positive for neck pain.  Skin:  Negative for wound.  Neurological:  Positive for headaches.    Physical Exam Updated Vital Signs BP 129/76 (BP Location: Right Arm)   Pulse 67   Temp 97.7 F (36.5  C) (Oral)   Resp 16   Ht 5\' 4"  (1.626 m)   Wt 97.5 kg   SpO2 100%   BMI 36.90 kg/m  Physical Exam Vitals and nursing note reviewed.  Constitutional:      General: She is not in acute distress.    Appearance: Normal appearance. She is well-developed.  HENT:     Head: Normocephalic.     Comments: She has some swelling above her right orbit.  There are some dried blood in her nose but no septal hematoma.  No gross malocclusion.  No open wounds. Eyes:     Conjunctiva/sclera: Conjunctivae normal.  Cardiovascular:     Rate and Rhythm: Normal rate and regular rhythm.     Heart sounds: No murmur  heard. Pulmonary:     Effort: Pulmonary effort is normal. No respiratory distress.     Breath sounds: Normal breath sounds.  Abdominal:     Palpations: Abdomen is soft.     Tenderness: There is no abdominal tenderness.  Musculoskeletal:        General: Tenderness present. No deformity. Normal range of motion.     Cervical back: Neck supple.     Comments: She has some tenderness of her left hand.  Full range of motion of wrist elbow shoulder.  Lower extremities full range of motion without any pain or limitations.  Right upper extremity full range of motion without any pain or limitations.  Skin:    General: Skin is warm and dry.     Capillary Refill: Capillary refill takes less than 2 seconds.  Neurological:     General: No focal deficit present.     Mental Status: She is alert and oriented to person, place, and time.     Cranial Nerves: No cranial nerve deficit.     Sensory: No sensory deficit.     Motor: No weakness.     ED Results / Procedures / Treatments   Labs (all labs ordered are listed, but only abnormal results are displayed) Labs Reviewed  CBC WITH DIFFERENTIAL/PLATELET  BASIC METABOLIC PANEL  CBG MONITORING, ED    EKG EKG Interpretation  Date/Time:  Sunday August 29 2022 18:14:33 EDT Ventricular Rate:  68 PR Interval:  162 QRS Duration: 73 QT Interval:  424 QTC Calculation: 451 R Axis:   58 Text Interpretation: Sinus rhythm No significant change since prior 11/22 Confirmed by Aletta Edouard [phone]) on 08/29/2022 6:17:53 PM  Radiology No results found.  Procedures Procedures  {Document cardiac monitor, telemetry assessment procedure when appropriate:1}  Medications Ordered in ED Medications - No data to display  ED Course/ Medical Decision Making/ A&P                           Medical Decision Making Amount and/or Complexity of Data Reviewed Radiology: ordered.   This patient complains of ***; this involves an extensive number of  treatment Options and is a complaint that carries with it a high risk of complications and morbidity. The differential includes ***  I ordered, reviewed and interpreted labs, which included *** I ordered medication *** and reviewed PMP when indicated. I ordered imaging studies which included *** and I independently    visualized and interpreted imaging which showed *** Additional history obtained from *** Previous records obtained and reviewed *** I consulted *** and discussed lab and imaging findings and discussed disposition.  Cardiac monitoring reviewed, *** Social determinants considered, *** Critical Interventions: ***  After the interventions stated above, I reevaluated the patient and found *** Admission and further testing considered, ***   {Document critical care time when appropriate:1} {Document review of labs and clinical decision tools ie heart score, Chads2Vasc2 etc:1}  {Document your independent review of radiology images, and any outside records:1} {Document your discussion with family members, caretakers, and with consultants:1} {Document social determinants of health affecting pt's care:1} {Document your decision making why or why not admission, treatments were needed:1} Final Clinical Impression(s) / ED Diagnoses Final diagnoses:  None    Rx / DC Orders ED Discharge Orders     None

## 2022-08-29 NOTE — ED Triage Notes (Addendum)
[place] [facility] after sustaining a fall while walking down steps to go to the restroom. She lost her balance and fell down 4-5 steps, landing on her face. Denies LOC, no thinners. No neck or back pain; pt reports headache, nausea, dizziness with movement. Large hematoma over left eye and nosebleed on scene, controlled while en route. A/O on arrival, ambulatory at baseline. Pt states she has left arm pain, headache, and facial pain.   BP 142/70 HR 72 O2 99% CBG 126

## 2022-08-30 NOTE — ED Notes (Signed)
Pt ambulated in hall with steady gait with no assistance needed, reports mild dizziness

## 2022-11-30 ENCOUNTER — Encounter: Payer: Self-pay | Admitting: Plastic Surgery

## 2022-11-30 ENCOUNTER — Other Ambulatory Visit: Payer: Self-pay | Admitting: Internal Medicine

## 2022-11-30 ENCOUNTER — Ambulatory Visit: Payer: Medicare PPO | Admitting: Plastic Surgery

## 2022-11-30 VITALS — BP 151/82 | HR 72 | Ht 64.0 in | Wt 202.2 lb

## 2022-11-30 DIAGNOSIS — S022XXA Fracture of nasal bones, initial encounter for closed fracture: Secondary | ICD-10-CM | POA: Diagnosis not present

## 2022-11-30 DIAGNOSIS — S0292XA Unspecified fracture of facial bones, initial encounter for closed fracture: Secondary | ICD-10-CM | POA: Insufficient documentation

## 2022-11-30 DIAGNOSIS — S0232XA Fracture of orbital floor, left side, initial encounter for closed fracture: Secondary | ICD-10-CM | POA: Diagnosis not present

## 2022-11-30 DIAGNOSIS — W108XXA Fall (on) (from) other stairs and steps, initial encounter: Secondary | ICD-10-CM | POA: Diagnosis not present

## 2022-11-30 DIAGNOSIS — Z1231 Encounter for screening mammogram for malignant neoplasm of breast: Secondary | ICD-10-CM

## 2022-11-30 NOTE — Progress Notes (Signed)
Patient ID: Andrea Doyle, female    DOB: Mar 10, 1946, 77 y.o.   MRN: [id number]   Chief Complaint  Patient presents with   Consult   Facial Injury    The patient is a 77 year old female here for evaluation of her face.  In October she fell while at church down steps.  She had severe swelling and bruising and was seen in the emergency room.  She had a fracture of the left orbital floor and left nasal bone.  She is now doing really well.  She is a little fearful to go up and down steps.  The swelling has resolved.  She has a little bit of color change around the periorbital area on the left.  But no asymmetry from bony standpoint and no trouble breathing.     Review of Systems  Constitutional: Negative.   HENT: Negative.    Eyes: Negative.   Respiratory: Negative.  Negative for chest tightness.   Cardiovascular: Negative.   Gastrointestinal: Negative.   Endocrine: Negative.   Genitourinary: Negative.   Musculoskeletal: Negative.     Past Medical History:  Diagnosis Date   Diabetes mellitus    High cholesterol     History reviewed. No pertinent surgical history.    Current Outpatient Medications:    aspirin 325 MG tablet, Take 650 mg by mouth every 4 (four) hours as needed for mild pain., Disp: , Rfl:    bismuth subsalicylate (PEPTO BISMOL) 262 MG/15ML suspension, Take 30 mLs by mouth every 6 (six) hours as needed for diarrhea or loose stools., Disp: , Rfl:    Calcium Carbonate-Vitamin D (CALCIUM 600+D) 600-200 MG-UNIT TABS, Take 1 tablet by mouth daily., Disp: , Rfl:    cetirizine (ZYRTEC) 10 MG tablet, Take 10 mg by mouth daily as needed for allergies., Disp: , Rfl:    chlorthalidone (HYGROTON) 25 MG tablet, Take 25 mg by mouth as needed (fluid)., Disp: , Rfl:    fish oil-omega-3 fatty acids 1000 MG capsule, Take 1 g by mouth daily., Disp: , Rfl:    meclizine (ANTIVERT) 50 MG tablet, Take 1 tablet (50 mg total) by mouth 3 (three) times daily as needed., Disp: 30  tablet, Rfl: 0   metFORMIN (GLUCOPHAGE) 500 MG tablet, Take 500 mg by mouth 2 (two) times daily with a meal., Disp: , Rfl:    Multiple Vitamin (MULITIVITAMIN WITH MINERALS) TABS, Take 1 tablet by mouth daily., Disp: , Rfl:    ondansetron (ZOFRAN) 4 MG tablet, Take 1 tablet (4 mg total) by mouth every 6 (six) hours., Disp: 12 tablet, Rfl: 0   simvastatin (ZOCOR) 20 MG tablet, Take 20 mg by mouth at bedtime., Disp: , Rfl:    vitamin B-12 (CYANOCOBALAMIN) 1000 MCG tablet, Take 1,000 mcg by mouth daily., Disp: , Rfl:    Objective:   Vitals:   11/30/22 1031  BP: (!) 151/82  Pulse: 72  SpO2: 99%    Physical Exam Vitals reviewed.  Constitutional:      Appearance: Normal appearance.  HENT:     Head: Normocephalic.  Cardiovascular:     Rate and Rhythm: Normal rate.     Pulses: Normal pulses.  Pulmonary:     Effort: Pulmonary effort is normal.  Musculoskeletal:        General: No swelling or deformity.  Skin:    General: Skin is warm.     Capillary Refill: Capillary refill takes less than 2 seconds.     Coloration: Skin is  not jaundiced.     Findings: No bruising or lesion.  Neurological:     Mental Status: She is alert and oriented to person, place, and time.  Psychiatric:        Mood and Affect: Mood normal.        Behavior: Behavior normal.        Thought Content: Thought content normal.     Assessment & Plan:  Facial fracture due to fall, closed, initial encounter ([place])  No surgical intervention needed.  We will refer her to [facility] for skin evaluation.  Lilly may have some suggestions on the hyperpigmentation like hydroquinone.  Pictures were obtained of the patient and placed in the chart with the patient's or guardian's permission.   [place], DO

## 2022-12-10 ENCOUNTER — Ambulatory Visit
Admission: RE | Admit: 2022-12-10 | Discharge: 2022-12-10 | Disposition: A | Discharge status: Home / Self Care | Payer: Medicare PPO | Source: Ambulatory Visit | Attending: Internal Medicine | Admitting: Internal Medicine

## 2022-12-10 DIAGNOSIS — Z1231 Encounter for screening mammogram for malignant neoplasm of breast: Secondary | ICD-10-CM

## 2022-12-15 ENCOUNTER — Other Ambulatory Visit: Payer: Self-pay | Admitting: Internal Medicine

## 2022-12-15 DIAGNOSIS — R928 Other abnormal and inconclusive findings on diagnostic imaging of breast: Secondary | ICD-10-CM

## 2022-12-21 ENCOUNTER — Ambulatory Visit
Admission: RE | Admit: 2022-12-21 | Discharge: 2022-12-21 | Disposition: A | Discharge status: Home / Self Care | Payer: Medicare PPO | Source: Ambulatory Visit | Attending: Internal Medicine | Admitting: Internal Medicine

## 2022-12-21 DIAGNOSIS — R928 Other abnormal and inconclusive findings on diagnostic imaging of breast: Secondary | ICD-10-CM

## 2023-01-31 ENCOUNTER — Emergency Department (HOSPITAL_BASED_OUTPATIENT_CLINIC_OR_DEPARTMENT_OTHER)
Admission: EM | Admit: 2023-01-31 | Discharge: 2023-01-31 | Disposition: A | Discharge status: Home / Self Care | Payer: Medicare PPO | Attending: Emergency Medicine | Admitting: Emergency Medicine

## 2023-01-31 ENCOUNTER — Emergency Department (HOSPITAL_BASED_OUTPATIENT_CLINIC_OR_DEPARTMENT_OTHER): Payer: Medicare PPO

## 2023-01-31 ENCOUNTER — Encounter (HOSPITAL_BASED_OUTPATIENT_CLINIC_OR_DEPARTMENT_OTHER): Payer: Self-pay | Admitting: Emergency Medicine

## 2023-01-31 DIAGNOSIS — E119 Type 2 diabetes mellitus without complications: Secondary | ICD-10-CM | POA: Diagnosis not present

## 2023-01-31 DIAGNOSIS — Z7984 Long term (current) use of oral hypoglycemic drugs: Secondary | ICD-10-CM | POA: Diagnosis not present

## 2023-01-31 DIAGNOSIS — Z7982 Long term (current) use of aspirin: Secondary | ICD-10-CM | POA: Diagnosis not present

## 2023-01-31 DIAGNOSIS — R42 Dizziness and giddiness: Secondary | ICD-10-CM | POA: Insufficient documentation

## 2023-01-31 LAB — BASIC METABOLIC PANEL
Anion gap: 4 — ABNORMAL LOW (ref 5–15)
BUN: 17 mg/dL (ref 8–23)
CO2: 25 mmol/L (ref 22–32)
Calcium: 8.4 mg/dL — ABNORMAL LOW (ref 8.9–10.3)
Chloride: 108 mmol/L (ref 98–111)
Creatinine, Ser: 0.73 mg/dL (ref 0.44–1.00)
GFR, Estimated: >60 mL/min (ref >60)
Glucose, Bld: 96 mg/dL (ref 70–99)
Potassium: 3.5 mmol/L (ref 3.5–5.1)
Sodium: 137 mmol/L (ref 135–145)

## 2023-01-31 LAB — CBC WITH DIFFERENTIAL/PLATELET
Abs Immature Granulocytes: 0.02 10*3/uL (ref 0.00–0.07)
Basophils Absolute: 0 10*3/uL (ref 0.0–0.1)
Basophils Relative: 0 %
Eosinophils Absolute: 0.2 10*3/uL (ref 0.0–0.5)
Eosinophils Relative: 2 %
HCT: 38.1 % (ref 36.0–46.0)
Hemoglobin: 13.1 g/dL (ref 12.0–15.0)
Immature Granulocytes: 0 %
Lymphocytes Relative: 16 %
Lymphs Abs: 1.4 10*3/uL (ref 0.7–4.0)
MCH: 31.6 pg (ref 26.0–34.0)
MCHC: 34.4 g/dL (ref 30.0–36.0)
MCV: 92 fL (ref 80.0–100.0)
Monocytes Absolute: 0.5 10*3/uL (ref 0.1–1.0)
Monocytes Relative: 6 %
Neutro Abs: 6.8 10*3/uL (ref 1.7–7.7)
Neutrophils Relative %: 76 %
Platelets: 231 10*3/uL (ref 150–400)
RBC: 4.14 MIL/uL (ref 3.87–5.11)
RDW: 12.7 % (ref 11.5–15.5)
WBC: 9 10*3/uL (ref 4.0–10.5)
nRBC: 0 % (ref 0.0–0.2)

## 2023-01-31 MED ORDER — DIAZEPAM 5 MG PO TABS: 5.0000 mg | ORAL_TABLET | Freq: Two times a day (BID) | ORAL | 0 refills | Status: AC | PRN

## 2023-01-31 MED ORDER — ONDANSETRON HCL 4 MG/2ML IJ SOLN: 4.0000 mg | Freq: Once | INTRAMUSCULAR | Status: DC

## 2023-01-31 MED ORDER — SODIUM CHLORIDE 0.9 % IV BOLUS
500.0000 mL | Freq: Once | INTRAVENOUS | Status: AC
  Administered 2023-01-31: 500 mL via INTRAVENOUS

## 2023-01-31 MED ORDER — ONDANSETRON HCL 4 MG/2ML IJ SOLN
4.0000 mg | Freq: Once | INTRAMUSCULAR | Status: DC
  Filled 2023-01-31: qty 2

## 2023-01-31 MED ORDER — DIAZEPAM 5 MG/ML IJ SOLN
2.5000 mg | Freq: Once | INTRAMUSCULAR | Status: AC
  Administered 2023-01-31: 2.5 mg via INTRAVENOUS
  Filled 2023-01-31: qty 2

## 2023-01-31 NOTE — ED Triage Notes (Signed)
Per EMS, NVD with dizziness since am more severe X 3 hours has Hx of same but is out of meds.

## 2023-01-31 NOTE — Discharge Instructions (Signed)
Continue medications as previously prescribed.  Begin taking Valium as prescribed as needed for dizziness.

## 2023-01-31 NOTE — ED Provider Notes (Signed)
[facility] Provider Note   CSN: [id number] Arrival date & time: 01/31/23  0023     History  Chief Complaint  Patient presents with   Emesis   Dizziness    Andrea Doyle is a 77 y.o. female.  Patient is a 77 year old female with past medical history of vertigo, type 2 diabetes, hyperlipidemia.  Patient presenting today for evaluation of dizziness.  This started earlier today, then became much worse this evening.  She describes the dizziness as a spinning sensation that is worse when she attempts to move or change position.  She denies weakness or numbness of the extremities.  She does report some headache, but no visual disturbances.  Patient had to call 911, then was transported here.  She was given IV fluids and route and seems to be feeling improved.  The history is provided by the patient.       Home Medications Prior to Admission medications   Medication Sig Start Date End Date Taking? Authorizing Provider  aspirin 325 MG tablet Take 650 mg by mouth every 4 (four) hours as needed for mild pain.    [provider]  bismuth subsalicylate (PEPTO BISMOL) 262 MG/15ML suspension Take 30 mLs by mouth every 6 (six) hours as needed for diarrhea or loose stools.    [provider]  Calcium Carbonate-Vitamin D (CALCIUM 600+D) 600-200 MG-UNIT TABS Take 1 tablet by mouth daily.    [provider]  cetirizine (ZYRTEC) 10 MG tablet Take 10 mg by mouth daily as needed for allergies.    [provider]  chlorthalidone (HYGROTON) 25 MG tablet Take 25 mg by mouth as needed (fluid).    [provider]  fish oil-omega-3 fatty acids 1000 MG capsule Take 1 g by mouth daily.    [provider]  meclizine (ANTIVERT) 50 MG tablet Take 1 tablet (50 mg total) by mouth 3 (three) times daily as needed. 06/21/14   Montine Circle, PA-C  metFORMIN (GLUCOPHAGE) 500 MG tablet Take 500 mg by mouth 2 (two)  times daily with a meal.    [provider]  Multiple Vitamin (MULITIVITAMIN WITH MINERALS) TABS Take 1 tablet by mouth daily.    [provider]  ondansetron (ZOFRAN) 4 MG tablet Take 1 tablet (4 mg total) by mouth every 6 (six) hours. 01/30/15   Quintella Reichert, MD  simvastatin (ZOCOR) 20 MG tablet Take 20 mg by mouth at bedtime.    [provider]  vitamin B-12 (CYANOCOBALAMIN) 1000 MCG tablet Take 1,000 mcg by mouth daily.    [provider]      Allergies    Codeine and Penicillins    Review of Systems   Review of Systems  All other systems reviewed and are negative.   Physical Exam Updated Vital Signs BP 139/63 (BP Location: Right Arm)   Pulse (!) 59   Temp 98.4 F (36.9 C) (Oral)   Resp 16   Ht 5\' 4"  (1.626 m)   Wt 84.4 kg   SpO2 98%   BMI 31.93 kg/m  Physical Exam Vitals and nursing note reviewed.  Constitutional:      General: She is not in acute distress.    Appearance: She is well-developed. She is not diaphoretic.  HENT:     Head: Normocephalic and atraumatic.  Eyes:     Extraocular Movements: Extraocular movements intact.     Pupils: Pupils are equal, round, and reactive to light.  Cardiovascular:  Rate and Rhythm: Normal rate and regular rhythm.     Heart sounds: No murmur heard.    No friction rub. No gallop.  Pulmonary:     Effort: Pulmonary effort is normal. No respiratory distress.     Breath sounds: Normal breath sounds. No wheezing.  Abdominal:     General: Bowel sounds are normal. There is no distension.     Palpations: Abdomen is soft.     Tenderness: There is no abdominal tenderness.  Musculoskeletal:        General: Normal range of motion.     Cervical back: Normal range of motion and neck supple.  Skin:    General: Skin is warm and dry.  Neurological:     General: No focal deficit present.     Mental Status: She is alert and oriented to person, place, and time.     Cranial Nerves: No cranial nerve  deficit.     ED Results / Procedures / Treatments   Labs (all labs ordered are listed, but only abnormal results are displayed) Labs Reviewed - No data to display  EKG EKG Interpretation  Date/Time:  Monday January 31 2023 01:55:24 EDT Ventricular Rate:  67 PR Interval:  188 QRS Duration: 79 QT Interval:  454 QTC Calculation: 480 R Axis:   52 Text Interpretation: Sinus rhythm Abnormal R-wave progression, early transition Confirmed by Veryl Speak [phone]) on 01/31/2023 2:03:57 AM  Radiology No results found.  Procedures Procedures    Medications Ordered in ED Medications  diazepam (VALIUM) injection 2.5 mg (has no administration in time range)  ondansetron (ZOFRAN) injection 4 mg (has no administration in time range)  ondansetron (ZOFRAN) injection 4 mg (has no administration in time range)  sodium chloride 0.9 % bolus 500 mL (has no administration in time range)    ED Course/ Medical Decision Making/ A&P  Patient brought by EMS for evaluation of dizziness.  This started earlier today, then rapidly worsened this evening.  Her symptoms are worse when she turns her head and tries to move.  They are relieved with rest.  Patient arrives here with stable vital signs and is actually feeling somewhat improved after receiving IV fluids by EMS.  Her physical examination is unremarkable and neurologic exam is nonfocal.  Workup initiated including CBC and basic metabolic panel, both of which are unremarkable.  CT scan of the head obtained showing no acute process.  Patient was administered IV fluids along with Zofran and Valium and seems to be much improved.  Patient's symptoms are most consistent with a peripheral vertigo.  She is now back to baseline and has been ambulatory to the bathroom in the emergency department.  At this point, I feel as though she can safely be discharged.  She has to follow-up with primary doctor as needed.  Final Clinical Impression(s) / ED  Diagnoses Final diagnoses:  None    Rx / DC Orders ED Discharge Orders     None         Veryl Speak, MD 01/31/23 [phone]

## 2023-06-22 ENCOUNTER — Emergency Department (HOSPITAL_BASED_OUTPATIENT_CLINIC_OR_DEPARTMENT_OTHER)
Admission: EM | Admit: 2023-06-22 | Discharge: 2023-06-22 | Disposition: A | Discharge status: Home / Self Care | Payer: Medicare PPO | Attending: Emergency Medicine | Admitting: Emergency Medicine

## 2023-06-22 ENCOUNTER — Encounter (HOSPITAL_BASED_OUTPATIENT_CLINIC_OR_DEPARTMENT_OTHER): Payer: Self-pay | Admitting: Emergency Medicine

## 2023-06-22 ENCOUNTER — Emergency Department (HOSPITAL_BASED_OUTPATIENT_CLINIC_OR_DEPARTMENT_OTHER): Payer: Medicare PPO

## 2023-06-22 DIAGNOSIS — Z7982 Long term (current) use of aspirin: Secondary | ICD-10-CM | POA: Insufficient documentation

## 2023-06-22 DIAGNOSIS — U071 COVID-19: Secondary | ICD-10-CM | POA: Insufficient documentation

## 2023-06-22 DIAGNOSIS — R1084 Generalized abdominal pain: Secondary | ICD-10-CM | POA: Insufficient documentation

## 2023-06-22 DIAGNOSIS — I10 Essential (primary) hypertension: Secondary | ICD-10-CM | POA: Insufficient documentation

## 2023-06-22 DIAGNOSIS — R059 Cough, unspecified: Secondary | ICD-10-CM | POA: Diagnosis present

## 2023-06-22 LAB — COMPREHENSIVE METABOLIC PANEL
ALT: 19 U/L (ref 0–44)
AST: 22 U/L (ref 15–41)
Albumin: 3.9 g/dL (ref 3.5–5.0)
Alkaline Phosphatase: 71 U/L (ref 38–126)
Anion gap: 8 (ref 5–15)
BUN: 18 mg/dL (ref 8–23)
CO2: 23 mmol/L (ref 22–32)
Calcium: 8.5 mg/dL — ABNORMAL LOW (ref 8.9–10.3)
Chloride: 105 mmol/L (ref 98–111)
Creatinine, Ser: 0.82 mg/dL (ref 0.44–1.00)
GFR, Estimated: >60 mL/min (ref >60)
Glucose, Bld: 145 mg/dL — ABNORMAL HIGH (ref 70–99)
Potassium: 4.3 mmol/L (ref 3.5–5.1)
Sodium: 136 mmol/L (ref 135–145)
Total Bilirubin: 1.2 mg/dL (ref 0.3–1.2)
Total Protein: 7.3 g/dL (ref 6.5–8.1)

## 2023-06-22 LAB — CBC WITH DIFFERENTIAL/PLATELET
Abs Immature Granulocytes: 0.03 10*3/uL (ref 0.00–0.07)
Basophils Absolute: 0 10*3/uL (ref 0.0–0.1)
Basophils Relative: 0 %
Eosinophils Absolute: 0.1 10*3/uL (ref 0.0–0.5)
Eosinophils Relative: 1 %
HCT: 38.1 % (ref 36.0–46.0)
Hemoglobin: 12.9 g/dL (ref 12.0–15.0)
Immature Granulocytes: 0 %
Lymphocytes Relative: 9 %
Lymphs Abs: 0.9 10*3/uL (ref 0.7–4.0)
MCH: 30.8 pg (ref 26.0–34.0)
MCHC: 33.9 g/dL (ref 30.0–36.0)
MCV: 90.9 fL (ref 80.0–100.0)
Monocytes Absolute: 0.5 10*3/uL (ref 0.1–1.0)
Monocytes Relative: 5 %
Neutro Abs: 8.3 10*3/uL — ABNORMAL HIGH (ref 1.7–7.7)
Neutrophils Relative %: 85 %
Platelets: 171 10*3/uL (ref 150–400)
RBC: 4.19 MIL/uL (ref 3.87–5.11)
RDW: 13 % (ref 11.5–15.5)
WBC: 9.9 10*3/uL (ref 4.0–10.5)
nRBC: 0 % (ref 0.0–0.2)

## 2023-06-22 LAB — SARS CORONAVIRUS 2 BY RT PCR: SARS Coronavirus 2 by RT PCR: POSITIVE — AB

## 2023-06-22 LAB — TROPONIN I (HIGH SENSITIVITY): Troponin I (High Sensitivity): <2 ng/L (ref <18)

## 2023-06-22 MED ORDER — PAXLOVID (300/100) 20 X 150 MG & 10 X 100MG PO TBPK: 3.0000 | ORAL_TABLET | Freq: Two times a day (BID) | ORAL | 0 refills | Status: AC

## 2023-06-22 MED ORDER — ONDANSETRON 4 MG PO TBDP: 4.0000 mg | ORAL_TABLET | Freq: Three times a day (TID) | ORAL | 0 refills | Status: AC | PRN

## 2023-06-22 MED ORDER — METOCLOPRAMIDE HCL 5 MG/ML IJ SOLN
10.0000 mg | Freq: Once | INTRAMUSCULAR | Status: AC
  Administered 2023-06-22: 10 mg via INTRAVENOUS
  Filled 2023-06-22: qty 2

## 2023-06-22 MED ORDER — SODIUM CHLORIDE 0.9 % IV BOLUS
500.0000 mL | Freq: Once | INTRAVENOUS | Status: AC
  Administered 2023-06-22: 500 mL via INTRAVENOUS

## 2023-06-22 NOTE — ED Triage Notes (Signed)
URI c/NVD since Sunday after church, called EMS for SOB, per EMS lungs clear and all VS normal.

## 2023-06-22 NOTE — ED Notes (Signed)
ED Provider at bedside. 

## 2023-06-22 NOTE — Discharge Instructions (Addendum)
Stop taking your cholesterol medication while taking paxlovid (the medication for COVID 19).

## 2023-06-22 NOTE — ED Provider Notes (Signed)
[place] [facility] Provider Note   CSN: [id number] Arrival date & time: 06/22/23  0146     History  Chief Complaint  Patient presents with   URI   Emesis   Diarrhea    Andrea Doyle is a 77 y.o. female.  The history is provided by the patient, the EMS personnel and medical records.  URI Emesis Associated symptoms: diarrhea and URI   Diarrhea Associated symptoms: URI and vomiting   Andrea Doyle is a 77 y.o. female who presents to the Emergency Department complaining of vomiting, cough.  She presents to the emergency department by EMS for symptoms that started on Sunday night.  She initially developed nasal congestion, cough on Sunday with associated chills.  No reported fevers.  On Monday she continued to have congestion and cough but then developed vomiting and diarrhea as well.  Her symptoms persisted on Tuesday and worsen.  She called EMS tonight due to severe symptoms.  She does live at home alone.  No significant abdominal pain.  She does have chest discomfort when she coughs.  No dysuria.  She is prediabetic.  She does have a history of hypertension.     Home Medications Prior to Admission medications   Medication Sig Start Date End Date Taking? Authorizing Provider  nirmatrelvir & ritonavir (PAXLOVID, 300/100,) 20 x 150 MG & 10 x 100MG  TBPK Take 3 tablets by mouth 2 (two) times daily for 5 days. 06/22/23 06/27/23 Yes Tilden Fossa, MD  ondansetron (ZOFRAN-ODT) 4 MG disintegrating tablet Take 1 tablet (4 mg total) by mouth every 8 (eight) hours as needed. 06/22/23  Yes Tilden Fossa, MD  aspirin 325 MG tablet Take 650 mg by mouth every 4 (four) hours as needed for mild pain.    [provider]  bismuth subsalicylate (PEPTO BISMOL) 262 MG/15ML suspension Take 30 mLs by mouth every 6 (six) hours as needed for diarrhea or loose stools.    [provider]  Calcium Carbonate-Vitamin D (CALCIUM 600+D) 600-200 MG-UNIT  TABS Take 1 tablet by mouth daily.    [provider]  cetirizine (ZYRTEC) 10 MG tablet Take 10 mg by mouth daily as needed for allergies.    [provider]  chlorthalidone (HYGROTON) 25 MG tablet Take 25 mg by mouth as needed (fluid).    [provider]  diazepam (VALIUM) 5 MG tablet Take 1 tablet (5 mg total) by mouth every 12 (twelve) hours as needed for anxiety. 01/31/23   Geoffery Lyons, MD  fish oil-omega-3 fatty acids 1000 MG capsule Take 1 g by mouth daily.    [provider]  meclizine (ANTIVERT) 50 MG tablet Take 1 tablet (50 mg total) by mouth 3 (three) times daily as needed. 06/21/14   Roxy Horseman, PA-C  metFORMIN (GLUCOPHAGE) 500 MG tablet Take 500 mg by mouth 2 (two) times daily with a meal.    [provider]  Multiple Vitamin (MULITIVITAMIN WITH MINERALS) TABS Take 1 tablet by mouth daily.    [provider]  ondansetron (ZOFRAN) 4 MG tablet Take 1 tablet (4 mg total) by mouth every 6 (six) hours. 01/30/15   Tilden Fossa, MD  vitamin B-12 (CYANOCOBALAMIN) 1000 MCG tablet Take 1,000 mcg by mouth daily.    [provider]      Allergies    Codeine and Penicillins    Review of Systems   Review of Systems  Gastrointestinal:  Positive for diarrhea and vomiting.  All other  systems reviewed and are negative.   Physical Exam Updated Vital Signs BP (!) 137/58 (BP Location: Right Arm)   Pulse 70   Temp 98.4 F (36.9 C) (Oral)   Resp 14   Ht 5\' 4"  (1.626 m)   Wt 84.8 kg   SpO2 99%   BMI 32.10 kg/m  Physical Exam Vitals and nursing note reviewed.  Constitutional:      Appearance: She is well-developed.  HENT:     Head: Normocephalic and atraumatic.  Cardiovascular:     Rate and Rhythm: Normal rate and regular rhythm.     Heart sounds: No murmur heard. Pulmonary:     Effort: Pulmonary effort is normal. No respiratory distress.     Breath sounds: Normal breath sounds.  Abdominal:     Palpations: Abdomen  is soft.     Tenderness: There is no guarding or rebound.     Comments: Mild generalized abdominal tenderness  Musculoskeletal:        General: No swelling or tenderness.     Comments: 2+ DP pulses  Skin:    General: Skin is warm and dry.  Neurological:     Mental Status: She is alert and oriented to person, place, and time.  Psychiatric:        Behavior: Behavior normal.     ED Results / Procedures / Treatments   Labs (all labs ordered are listed, but only abnormal results are displayed) Labs Reviewed  SARS CORONAVIRUS 2 BY RT PCR - Abnormal; Notable for the following components:      Result Value   SARS Coronavirus 2 by RT PCR POSITIVE (*)    All other components within normal limits  COMPREHENSIVE METABOLIC PANEL - Abnormal; Notable for the following components:   Glucose, Bld 145 (*)    Calcium 8.5 (*)    All other components within normal limits  CBC WITH DIFFERENTIAL/PLATELET - Abnormal; Notable for the following components:   Neutro Abs 8.3 (*)    All other components within normal limits  TROPONIN I (HIGH SENSITIVITY)    EKG EKG Interpretation Date/Time:  Wednesday June 22 2023 02:30:03 EDT Ventricular Rate:  72 PR Interval:  165 QRS Duration:  77 QT Interval:  411 QTC Calculation: 450 R Axis:   60  Text Interpretation: Sinus rhythm Abnormal R-wave progression, early transition Confirmed by Tilden Fossa [phone]) on 06/22/2023 2:31:57 AM  Radiology DG Chest Port 1 View  Result Date: 06/22/2023 CLINICAL DATA:  Chest pain and cough. EXAM: PORTABLE CHEST 1 VIEW COMPARISON:  None Available. FINDINGS: The heart size and mediastinal contours are within normal limits. There is no evidence of acute infiltrate, pleural effusion or pneumothorax. Multilevel degenerative changes are seen throughout the thoracic spine. IMPRESSION: No active cardiopulmonary disease. Electronically Signed   By: Aram Candela M.D.   On: 06/22/2023 02:37    Procedures Procedures     Medications Ordered in ED Medications  sodium chloride 0.9 % bolus 500 mL (500 mLs Intravenous New Bag/Given 06/22/23 0239)  metoCLOPramide (REGLAN) injection 10 mg (10 mg Intravenous Given 06/22/23 0239)    ED Course/ Medical Decision Making/ A&P                                 Medical Decision Making Amount and/or Complexity of Data Reviewed Labs: ordered. Radiology: ordered.  Risk Prescription drug management.   Patient with history of hypertension here for evaluation of upper respiratory  symptoms, vomiting and diarrhea.  She is nontoxic-appearing on evaluation with no respiratory distress.  Abdominal examination is benign.  Labs without significant electrolyte abnormality.  She is positive for COVID-19 infection.  Chest x-ray without acute infiltrate or evidence of CHF-images personally reviewed and interpreted, agree with radiologist interpretation.  Patient is feeling improved after medications in the emergency department.  No recurrent vomiting.  Discussed with patient findings of COVID-19 infection.  Discussed option for antiviral therapy.  She is currently unaware of her exact home medication regimen.  Discussed that she needs to discuss her home medications with her pharmacist to ensure that this is a safe medication for her to take.  Discussed discontinuing her statin.  Will prescribe ondansetron as needed nausea.  Plan to discharge with outpatient follow-up and return precautions.  Current abdominal exam is not consistent with SBO, pancreatitis, cholecystitis, appendicitis, diverticulitis.        Final Clinical Impression(s) / ED Diagnoses Final diagnoses:  COVID-19 virus infection    Rx / DC Orders ED Discharge Orders          Ordered    nirmatrelvir & ritonavir (PAXLOVID, 300/100,) 20 x 150 MG & 10 x 100MG  TBPK  2 times daily       Note to Pharmacy: Patient is unsure of her medications - please confirm there are no drug interactions with her other prescription  medications.   06/22/23 0308    ondansetron (ZOFRAN-ODT) 4 MG disintegrating tablet  Every 8 hours PRN        06/22/23 0308              Tilden Fossa, MD 06/22/23 [phone]

## 2024-01-02 ENCOUNTER — Other Ambulatory Visit: Payer: Self-pay | Admitting: Internal Medicine

## 2024-01-02 DIAGNOSIS — Z1231 Encounter for screening mammogram for malignant neoplasm of breast: Secondary | ICD-10-CM

## 2024-01-13 ENCOUNTER — Ambulatory Visit
Admission: RE | Admit: 2024-01-13 | Discharge: 2024-01-13 | Disposition: A | Discharge status: Home / Self Care | Payer: Medicare PPO | Source: Ambulatory Visit | Attending: Internal Medicine | Admitting: Internal Medicine

## 2024-01-13 DIAGNOSIS — Z1231 Encounter for screening mammogram for malignant neoplasm of breast: Secondary | ICD-10-CM

## 2024-01-18 ENCOUNTER — Other Ambulatory Visit: Payer: Self-pay | Admitting: Internal Medicine

## 2024-01-18 DIAGNOSIS — R928 Other abnormal and inconclusive findings on diagnostic imaging of breast: Secondary | ICD-10-CM

## 2024-01-30 ENCOUNTER — Ambulatory Visit
Admission: RE | Admit: 2024-01-30 | Discharge: 2024-01-30 | Disposition: A | Discharge status: Home / Self Care | Source: Ambulatory Visit | Attending: Internal Medicine | Admitting: Internal Medicine

## 2024-01-30 ENCOUNTER — Ambulatory Visit

## 2024-01-30 DIAGNOSIS — R928 Other abnormal and inconclusive findings on diagnostic imaging of breast: Secondary | ICD-10-CM
